# Patient Record
Sex: Female | Born: 1945 | Race: White | Hispanic: No | Marital: Married | Smoking: Never smoker
Health system: Southern US, Community
[De-identification: ages and names within clinical notes are randomized; demographics above are authoritative.]

## PROBLEM LIST (undated history)

## (undated) DIAGNOSIS — M1990 Unspecified osteoarthritis, unspecified site: Secondary | ICD-10-CM

## (undated) DIAGNOSIS — I10 Essential (primary) hypertension: Secondary | ICD-10-CM

## (undated) DIAGNOSIS — Z974 Presence of external hearing-aid: Secondary | ICD-10-CM

## (undated) DIAGNOSIS — T7840XA Allergy, unspecified, initial encounter: Secondary | ICD-10-CM

## (undated) DIAGNOSIS — K219 Gastro-esophageal reflux disease without esophagitis: Secondary | ICD-10-CM

## (undated) HISTORY — PX: CARPAL TUNNEL RELEASE: SHX101

## (undated) HISTORY — DX: Allergy, unspecified, initial encounter: T78.40XA

---

## 1999-03-14 ENCOUNTER — Other Ambulatory Visit: Admission: RE | Admit: 1999-03-14 | Discharge: 1999-03-14 | Payer: Self-pay | Admitting: Obstetrics and Gynecology

## 1999-04-04 ENCOUNTER — Encounter (INDEPENDENT_AMBULATORY_CARE_PROVIDER_SITE_OTHER): Payer: Self-pay

## 1999-04-04 ENCOUNTER — Other Ambulatory Visit: Admission: RE | Admit: 1999-04-04 | Discharge: 1999-04-04 | Payer: Self-pay | Admitting: *Deleted

## 1999-09-16 ENCOUNTER — Other Ambulatory Visit: Admission: RE | Admit: 1999-09-16 | Discharge: 1999-09-16 | Payer: Self-pay | Admitting: Obstetrics and Gynecology

## 2000-03-23 ENCOUNTER — Encounter: Admission: RE | Admit: 2000-03-23 | Discharge: 2000-03-23 | Payer: Self-pay | Admitting: Family Medicine

## 2000-03-23 ENCOUNTER — Encounter: Payer: Self-pay | Admitting: Family Medicine

## 2000-04-02 ENCOUNTER — Encounter: Payer: Self-pay | Admitting: Family Medicine

## 2000-04-02 ENCOUNTER — Other Ambulatory Visit: Admission: RE | Admit: 2000-04-02 | Discharge: 2000-04-02 | Payer: Self-pay | Admitting: Obstetrics and Gynecology

## 2000-04-02 ENCOUNTER — Encounter: Admission: RE | Admit: 2000-04-02 | Discharge: 2000-04-02 | Payer: Self-pay | Admitting: Family Medicine

## 2000-10-30 ENCOUNTER — Other Ambulatory Visit: Admission: RE | Admit: 2000-10-30 | Discharge: 2000-10-30 | Payer: Self-pay | Admitting: Obstetrics and Gynecology

## 2002-06-15 ENCOUNTER — Other Ambulatory Visit: Admission: RE | Admit: 2002-06-15 | Discharge: 2002-06-15 | Payer: Self-pay | Admitting: Family Medicine

## 2003-02-28 ENCOUNTER — Encounter: Admission: RE | Admit: 2003-02-28 | Discharge: 2003-02-28 | Payer: Self-pay | Admitting: Family Medicine

## 2003-02-28 ENCOUNTER — Encounter: Payer: Self-pay | Admitting: Family Medicine

## 2003-07-06 ENCOUNTER — Other Ambulatory Visit: Admission: RE | Admit: 2003-07-06 | Discharge: 2003-07-06 | Payer: Self-pay | Admitting: Family Medicine

## 2004-07-19 ENCOUNTER — Other Ambulatory Visit: Admission: RE | Admit: 2004-07-19 | Discharge: 2004-07-19 | Payer: Self-pay | Admitting: Family Medicine

## 2004-09-20 ENCOUNTER — Encounter: Admission: RE | Admit: 2004-09-20 | Discharge: 2004-09-20 | Payer: Self-pay | Admitting: Family Medicine

## 2005-09-16 ENCOUNTER — Other Ambulatory Visit: Admission: RE | Admit: 2005-09-16 | Discharge: 2005-09-16 | Payer: Self-pay | Admitting: Family Medicine

## 2005-10-15 ENCOUNTER — Encounter: Admission: RE | Admit: 2005-10-15 | Discharge: 2005-10-15 | Payer: Self-pay | Admitting: Family Medicine

## 2006-09-16 ENCOUNTER — Other Ambulatory Visit: Admission: RE | Admit: 2006-09-16 | Discharge: 2006-09-16 | Payer: Self-pay | Admitting: Family Medicine

## 2006-10-27 ENCOUNTER — Encounter: Admission: RE | Admit: 2006-10-27 | Discharge: 2006-10-27 | Payer: Self-pay | Admitting: Family Medicine

## 2007-11-03 ENCOUNTER — Encounter: Admission: RE | Admit: 2007-11-03 | Discharge: 2007-11-03 | Payer: Self-pay | Admitting: Family Medicine

## 2007-11-11 ENCOUNTER — Encounter: Admission: RE | Admit: 2007-11-11 | Discharge: 2007-11-11 | Payer: Self-pay | Admitting: Family Medicine

## 2008-09-21 ENCOUNTER — Other Ambulatory Visit: Admission: RE | Admit: 2008-09-21 | Discharge: 2008-09-21 | Payer: Self-pay | Admitting: Family Medicine

## 2008-11-30 ENCOUNTER — Encounter: Admission: RE | Admit: 2008-11-30 | Discharge: 2008-11-30 | Payer: Self-pay | Admitting: Family Medicine

## 2010-07-09 ENCOUNTER — Encounter: Admission: RE | Admit: 2010-07-09 | Discharge: 2010-07-09 | Payer: Self-pay | Admitting: Family Medicine

## 2010-09-25 ENCOUNTER — Other Ambulatory Visit
Admission: RE | Admit: 2010-09-25 | Discharge: 2010-09-25 | Payer: Self-pay | Source: Home / Self Care | Admitting: Family Medicine

## 2010-10-13 HISTORY — PX: CATARACT EXTRACTION: SUR2

## 2011-06-05 ENCOUNTER — Ambulatory Visit: Payer: Self-pay | Admitting: Ophthalmology

## 2011-06-25 ENCOUNTER — Ambulatory Visit: Payer: Self-pay | Admitting: Ophthalmology

## 2011-08-26 ENCOUNTER — Other Ambulatory Visit: Payer: Self-pay | Admitting: Family Medicine

## 2011-08-26 DIAGNOSIS — Z1231 Encounter for screening mammogram for malignant neoplasm of breast: Secondary | ICD-10-CM

## 2011-09-19 ENCOUNTER — Ambulatory Visit
Admission: RE | Admit: 2011-09-19 | Discharge: 2011-09-19 | Disposition: A | Discharge status: Home / Self Care | Payer: 59 | Source: Ambulatory Visit | Attending: Family Medicine | Admitting: Family Medicine

## 2011-09-19 DIAGNOSIS — Z1231 Encounter for screening mammogram for malignant neoplasm of breast: Secondary | ICD-10-CM

## 2012-07-20 DIAGNOSIS — Z23 Encounter for immunization: Secondary | ICD-10-CM | POA: Diagnosis not present

## 2012-07-22 DIAGNOSIS — H43819 Vitreous degeneration, unspecified eye: Secondary | ICD-10-CM | POA: Diagnosis not present

## 2012-08-26 DIAGNOSIS — H43819 Vitreous degeneration, unspecified eye: Secondary | ICD-10-CM | POA: Diagnosis not present

## 2012-10-28 ENCOUNTER — Other Ambulatory Visit: Payer: Self-pay | Admitting: Family Medicine

## 2012-10-28 ENCOUNTER — Other Ambulatory Visit (HOSPITAL_COMMUNITY)
Admission: RE | Admit: 2012-10-28 | Discharge: 2012-10-28 | Disposition: A | Discharge status: Home / Self Care | Payer: Medicare Other | Source: Ambulatory Visit | Attending: Family Medicine | Admitting: Family Medicine

## 2012-10-28 DIAGNOSIS — M949 Disorder of cartilage, unspecified: Secondary | ICD-10-CM | POA: Diagnosis not present

## 2012-10-28 DIAGNOSIS — Z0000 Encounter for general adult medical examination without abnormal findings: Secondary | ICD-10-CM | POA: Diagnosis not present

## 2012-10-28 DIAGNOSIS — Z124 Encounter for screening for malignant neoplasm of cervix: Secondary | ICD-10-CM | POA: Diagnosis not present

## 2012-10-28 DIAGNOSIS — Z23 Encounter for immunization: Secondary | ICD-10-CM | POA: Diagnosis not present

## 2012-10-28 DIAGNOSIS — M899 Disorder of bone, unspecified: Secondary | ICD-10-CM | POA: Diagnosis not present

## 2012-10-28 DIAGNOSIS — Z1211 Encounter for screening for malignant neoplasm of colon: Secondary | ICD-10-CM | POA: Diagnosis not present

## 2012-10-28 DIAGNOSIS — I10 Essential (primary) hypertension: Secondary | ICD-10-CM | POA: Diagnosis not present

## 2012-10-28 DIAGNOSIS — K219 Gastro-esophageal reflux disease without esophagitis: Secondary | ICD-10-CM | POA: Diagnosis not present

## 2012-10-28 DIAGNOSIS — Z131 Encounter for screening for diabetes mellitus: Secondary | ICD-10-CM | POA: Diagnosis not present

## 2012-10-28 DIAGNOSIS — E7800 Pure hypercholesterolemia, unspecified: Secondary | ICD-10-CM | POA: Diagnosis not present

## 2012-11-12 ENCOUNTER — Other Ambulatory Visit: Payer: Self-pay | Admitting: Family Medicine

## 2012-11-12 DIAGNOSIS — Z1231 Encounter for screening mammogram for malignant neoplasm of breast: Secondary | ICD-10-CM

## 2012-11-15 ENCOUNTER — Ambulatory Visit: Payer: 59

## 2012-11-16 DIAGNOSIS — Z1211 Encounter for screening for malignant neoplasm of colon: Secondary | ICD-10-CM | POA: Diagnosis not present

## 2012-11-16 DIAGNOSIS — Z124 Encounter for screening for malignant neoplasm of cervix: Secondary | ICD-10-CM | POA: Diagnosis not present

## 2012-11-18 DIAGNOSIS — H40009 Preglaucoma, unspecified, unspecified eye: Secondary | ICD-10-CM | POA: Diagnosis not present

## 2012-11-26 ENCOUNTER — Ambulatory Visit: Payer: 59

## 2012-12-03 ENCOUNTER — Ambulatory Visit
Admission: RE | Admit: 2012-12-03 | Discharge: 2012-12-03 | Disposition: A | Discharge status: Home / Self Care | Payer: Medicare Other | Source: Ambulatory Visit | Attending: Family Medicine | Admitting: Family Medicine

## 2012-12-03 DIAGNOSIS — Z1231 Encounter for screening mammogram for malignant neoplasm of breast: Secondary | ICD-10-CM | POA: Diagnosis not present

## 2012-12-15 DIAGNOSIS — M949 Disorder of cartilage, unspecified: Secondary | ICD-10-CM | POA: Diagnosis not present

## 2012-12-15 DIAGNOSIS — M899 Disorder of bone, unspecified: Secondary | ICD-10-CM | POA: Diagnosis not present

## 2013-04-27 DIAGNOSIS — I10 Essential (primary) hypertension: Secondary | ICD-10-CM | POA: Diagnosis not present

## 2013-05-19 DIAGNOSIS — H40009 Preglaucoma, unspecified, unspecified eye: Secondary | ICD-10-CM | POA: Diagnosis not present

## 2013-07-13 DIAGNOSIS — Z23 Encounter for immunization: Secondary | ICD-10-CM | POA: Diagnosis not present

## 2013-11-03 DIAGNOSIS — M899 Disorder of bone, unspecified: Secondary | ICD-10-CM | POA: Diagnosis not present

## 2013-11-03 DIAGNOSIS — Z79899 Other long term (current) drug therapy: Secondary | ICD-10-CM | POA: Diagnosis not present

## 2013-11-03 DIAGNOSIS — E7800 Pure hypercholesterolemia, unspecified: Secondary | ICD-10-CM | POA: Diagnosis not present

## 2013-11-03 DIAGNOSIS — K219 Gastro-esophageal reflux disease without esophagitis: Secondary | ICD-10-CM | POA: Diagnosis not present

## 2013-11-03 DIAGNOSIS — Z1211 Encounter for screening for malignant neoplasm of colon: Secondary | ICD-10-CM | POA: Diagnosis not present

## 2013-11-03 DIAGNOSIS — I10 Essential (primary) hypertension: Secondary | ICD-10-CM | POA: Diagnosis not present

## 2013-11-03 DIAGNOSIS — M949 Disorder of cartilage, unspecified: Secondary | ICD-10-CM | POA: Diagnosis not present

## 2013-11-03 DIAGNOSIS — Z0000 Encounter for general adult medical examination without abnormal findings: Secondary | ICD-10-CM | POA: Diagnosis not present

## 2013-11-11 DIAGNOSIS — Z1211 Encounter for screening for malignant neoplasm of colon: Secondary | ICD-10-CM | POA: Diagnosis not present

## 2013-11-21 DIAGNOSIS — H40009 Preglaucoma, unspecified, unspecified eye: Secondary | ICD-10-CM | POA: Diagnosis not present

## 2013-11-22 DIAGNOSIS — R05 Cough: Secondary | ICD-10-CM | POA: Diagnosis not present

## 2013-11-22 DIAGNOSIS — J069 Acute upper respiratory infection, unspecified: Secondary | ICD-10-CM | POA: Diagnosis not present

## 2013-11-22 DIAGNOSIS — R059 Cough, unspecified: Secondary | ICD-10-CM | POA: Diagnosis not present

## 2013-12-05 DIAGNOSIS — J209 Acute bronchitis, unspecified: Secondary | ICD-10-CM | POA: Diagnosis not present

## 2013-12-19 DIAGNOSIS — J209 Acute bronchitis, unspecified: Secondary | ICD-10-CM | POA: Diagnosis not present

## 2014-01-18 ENCOUNTER — Other Ambulatory Visit: Payer: Self-pay

## 2014-01-18 DIAGNOSIS — Z1231 Encounter for screening mammogram for malignant neoplasm of breast: Secondary | ICD-10-CM

## 2014-01-19 ENCOUNTER — Ambulatory Visit
Admission: RE | Admit: 2014-01-19 | Discharge: 2014-01-19 | Disposition: A | Discharge status: Home / Self Care | Payer: PRIVATE HEALTH INSURANCE | Source: Ambulatory Visit

## 2014-01-19 DIAGNOSIS — Z1231 Encounter for screening mammogram for malignant neoplasm of breast: Secondary | ICD-10-CM

## 2014-03-04 DIAGNOSIS — M25569 Pain in unspecified knee: Secondary | ICD-10-CM | POA: Diagnosis not present

## 2014-03-17 DIAGNOSIS — M1710 Unilateral primary osteoarthritis, unspecified knee: Secondary | ICD-10-CM | POA: Diagnosis not present

## 2014-05-04 DIAGNOSIS — R42 Dizziness and giddiness: Secondary | ICD-10-CM | POA: Diagnosis not present

## 2014-05-04 DIAGNOSIS — K219 Gastro-esophageal reflux disease without esophagitis: Secondary | ICD-10-CM | POA: Diagnosis not present

## 2014-05-04 DIAGNOSIS — M159 Polyosteoarthritis, unspecified: Secondary | ICD-10-CM | POA: Diagnosis not present

## 2014-05-04 DIAGNOSIS — I10 Essential (primary) hypertension: Secondary | ICD-10-CM | POA: Diagnosis not present

## 2014-05-15 DIAGNOSIS — H40009 Preglaucoma, unspecified, unspecified eye: Secondary | ICD-10-CM | POA: Diagnosis not present

## 2014-06-05 DIAGNOSIS — E871 Hypo-osmolality and hyponatremia: Secondary | ICD-10-CM | POA: Diagnosis not present

## 2014-06-13 DIAGNOSIS — Z23 Encounter for immunization: Secondary | ICD-10-CM | POA: Diagnosis not present

## 2014-07-12 DIAGNOSIS — M1710 Unilateral primary osteoarthritis, unspecified knee: Secondary | ICD-10-CM | POA: Diagnosis not present

## 2014-09-20 DIAGNOSIS — B351 Tinea unguium: Secondary | ICD-10-CM | POA: Diagnosis not present

## 2014-09-20 DIAGNOSIS — M79671 Pain in right foot: Secondary | ICD-10-CM | POA: Diagnosis not present

## 2014-09-20 DIAGNOSIS — M2042 Other hammer toe(s) (acquired), left foot: Secondary | ICD-10-CM | POA: Diagnosis not present

## 2014-09-20 DIAGNOSIS — M79672 Pain in left foot: Secondary | ICD-10-CM | POA: Diagnosis not present

## 2014-10-27 DIAGNOSIS — H40001 Preglaucoma, unspecified, right eye: Secondary | ICD-10-CM | POA: Diagnosis not present

## 2014-11-01 DIAGNOSIS — B351 Tinea unguium: Secondary | ICD-10-CM | POA: Diagnosis not present

## 2014-11-06 DIAGNOSIS — E780 Pure hypercholesterolemia: Secondary | ICD-10-CM | POA: Diagnosis not present

## 2014-11-06 DIAGNOSIS — M179 Osteoarthritis of knee, unspecified: Secondary | ICD-10-CM | POA: Diagnosis not present

## 2014-11-06 DIAGNOSIS — I10 Essential (primary) hypertension: Secondary | ICD-10-CM | POA: Diagnosis not present

## 2014-11-06 DIAGNOSIS — Z23 Encounter for immunization: Secondary | ICD-10-CM | POA: Diagnosis not present

## 2014-11-06 DIAGNOSIS — K219 Gastro-esophageal reflux disease without esophagitis: Secondary | ICD-10-CM | POA: Diagnosis not present

## 2014-11-09 DIAGNOSIS — H40001 Preglaucoma, unspecified, right eye: Secondary | ICD-10-CM | POA: Diagnosis not present

## 2015-01-23 DIAGNOSIS — S60021A Contusion of right index finger without damage to nail, initial encounter: Secondary | ICD-10-CM | POA: Diagnosis not present

## 2015-01-26 DIAGNOSIS — M79644 Pain in right finger(s): Secondary | ICD-10-CM | POA: Diagnosis not present

## 2015-01-26 DIAGNOSIS — M7989 Other specified soft tissue disorders: Secondary | ICD-10-CM | POA: Diagnosis not present

## 2015-01-26 DIAGNOSIS — S6710XA Crushing injury of unspecified finger(s), initial encounter: Secondary | ICD-10-CM | POA: Diagnosis not present

## 2015-01-31 DIAGNOSIS — M2042 Other hammer toe(s) (acquired), left foot: Secondary | ICD-10-CM | POA: Diagnosis not present

## 2015-01-31 DIAGNOSIS — B351 Tinea unguium: Secondary | ICD-10-CM | POA: Diagnosis not present

## 2015-02-06 ENCOUNTER — Ambulatory Visit
Admit: 2015-02-06 | Disposition: A | Discharge status: Home / Self Care | Payer: Self-pay | Attending: Orthopedic Surgery | Admitting: Orthopedic Surgery

## 2015-02-06 DIAGNOSIS — M79644 Pain in right finger(s): Secondary | ICD-10-CM | POA: Diagnosis not present

## 2015-02-08 ENCOUNTER — Other Ambulatory Visit: Payer: Self-pay

## 2015-02-08 DIAGNOSIS — Z1231 Encounter for screening mammogram for malignant neoplasm of breast: Secondary | ICD-10-CM

## 2015-02-16 ENCOUNTER — Emergency Department
Admission: EM | Admit: 2015-02-16 | Discharge: 2015-02-17 | Disposition: A | Discharge status: Home / Self Care | Payer: Medicare Other | Attending: Emergency Medicine | Admitting: Emergency Medicine

## 2015-02-16 ENCOUNTER — Encounter: Payer: Self-pay | Admitting: Emergency Medicine

## 2015-02-16 DIAGNOSIS — L299 Pruritus, unspecified: Secondary | ICD-10-CM | POA: Diagnosis not present

## 2015-02-16 DIAGNOSIS — L259 Unspecified contact dermatitis, unspecified cause: Secondary | ICD-10-CM | POA: Diagnosis not present

## 2015-02-16 DIAGNOSIS — I159 Secondary hypertension, unspecified: Secondary | ICD-10-CM | POA: Diagnosis not present

## 2015-02-16 DIAGNOSIS — I10 Essential (primary) hypertension: Secondary | ICD-10-CM | POA: Diagnosis not present

## 2015-02-16 DIAGNOSIS — R21 Rash and other nonspecific skin eruption: Secondary | ICD-10-CM | POA: Diagnosis not present

## 2015-02-16 HISTORY — DX: Gastro-esophageal reflux disease without esophagitis: K21.9

## 2015-02-16 HISTORY — DX: Essential (primary) hypertension: I10

## 2015-02-16 MED ORDER — TRIAMCINOLONE ACETONIDE 0.025 % EX OINT: 1.0000 "application " | TOPICAL_OINTMENT | Freq: Two times a day (BID) | CUTANEOUS | Status: AC

## 2015-02-16 MED ORDER — DEXAMETHASONE SODIUM PHOSPHATE 10 MG/ML IJ SOLN
10.0000 mg | Freq: Once | INTRAMUSCULAR | Status: AC
  Administered 2015-02-17: 10 mg via INTRAMUSCULAR

## 2015-02-16 MED ORDER — DEXAMETHASONE SODIUM PHOSPHATE 10 MG/ML IJ SOLN: 10.0000 mg | Freq: Once | INTRAMUSCULAR | Status: DC

## 2015-02-16 MED ORDER — RANITIDINE HCL 150 MG PO TABS: 150.0000 mg | ORAL_TABLET | Freq: Two times a day (BID) | ORAL | Status: AC

## 2015-02-16 MED ORDER — DEXAMETHASONE SODIUM PHOSPHATE 10 MG/ML IJ SOLN
INTRAMUSCULAR | Status: AC
  Administered 2015-02-17: 10 mg via INTRAMUSCULAR
  Filled 2015-02-16: qty 1

## 2015-02-16 MED ORDER — LORATADINE 10 MG PO TABS: 10.0000 mg | ORAL_TABLET | Freq: Every day | ORAL | Status: AC

## 2015-02-16 MED ORDER — FAMOTIDINE 20 MG PO TABS
ORAL_TABLET | ORAL | Status: AC
  Administered 2015-02-16: 40 mg via ORAL
  Filled 2015-02-16: qty 2

## 2015-02-16 MED ORDER — FAMOTIDINE 20 MG PO TABS
40.0000 mg | ORAL_TABLET | Freq: Once | ORAL | Status: AC
  Administered 2015-02-16: 40 mg via ORAL

## 2015-02-16 NOTE — ED Notes (Addendum)
Pt ambulatory reports around 530pm she developed funny feeling to her face and swelling to her lips. Pt also reports a rash developed to her arms and legs. Pt reports she took 50 mg of benadryl at 630pm. No swelling of tongue or throat region. Pt is speaking in full and complete sentences with no difficulty at this time. Pt now stating she went to [facility] to be seen for this but because her blood pressure was 190/109 they did not want to give her prednisone.

## 2015-02-16 NOTE — ED Notes (Signed)
Pt states she took 50mg  Benadryl at approx. 6:30PM prior to arrival to ER.

## 2015-02-16 NOTE — ED Provider Notes (Signed)
[facility] Emergency Department Provider Note    ____________________________________________  Time seen: 2152  I have reviewed the triage vital signs and the nursing notes.   HISTORY  Chief Complaint Allergic Reaction   \    HPI Vanessa Conley is a 69 y.o. female who developed a rash today unsure etiology only on her right arm and right leg the exact onset constantly itching rates it about 5 out of 10 no other complaints at this time nothing making it better or worse no other associated signs or symptoms     Past Medical History  Diagnosis Date  . Hypertension   . GERD (gastroesophageal reflux disease)     There are no active problems to display for this patient.   Past Surgical History  Procedure Laterality Date  . Cataract extraction  2012  . Carpal tunnel release      Current Outpatient Rx  Name  Route  Sig  Dispense  Refill  . loratadine (CLARITIN) 10 MG tablet   Oral   Take 1 tablet (10 mg total) by mouth daily.   30 tablet   2   . ranitidine (ZANTAC) 150 MG tablet   Oral   Take 1 tablet (150 mg total) by mouth 2 (two) times daily.   30 tablet   0   . triamcinolone (KENALOG) 0.025 % ointment   Topical   Apply 1 application topically 2 (two) times daily.   30 g   0     Allergies Alphagan  History reviewed. No pertinent family history.  Social History History  Substance Use Topics  . Smoking status: Never Smoker   . Smokeless tobacco: Not on file  . Alcohol Use: No    Review of Systems  Constitutional: Negative for fever. Eyes: Negative for visual changes. ENT: Negative for sore throat. Cardiovascular: Negative for chest pain. Respiratory: Negative for shortness of breath. Gastrointestinal: Negative for abdominal pain, vomiting and diarrhea. Genitourinary: Negative for dysuria. Musculoskeletal: Negative for back pain. Skin: Negative for rash. Neurological: Negative for headaches, focal weakness or  numbness.   10-point ROS otherwise negative.  ____________________________________________   PHYSICAL EXAM:  VITAL SIGNS: ED Triage Vitals  Enc Vitals Group     BP 02/16/15 2035 185/85 mmHg     Pulse Rate 02/16/15 2035 64     Resp 02/16/15 2216 18     Temp 02/16/15 2035 97.6 F (36.4 C)     Temp Source 02/16/15 2035 Oral     SpO2 02/16/15 2216 97 %     Weight 02/16/15 2035 177 lb (80.287 kg)     Height 02/16/15 2035 5\' 7"  (1.702 m)     Head Cir --      Peak Flow --      Pain Score --      Pain Loc --      Pain Edu? --      Excl. in [place]? --      Constitutional: Alert and oriented. Well appearing and in no distress. Eyes: Conjunctivae are normal. PERRL. Normal extraocular movements. ENT   Head: Normocephalic and atraumatic.   Nose: No congestion/rhinnorhea.   Mouth/Throat: Mucous membranes are moist.   Neck: No stridor. Hematological/Lymphatic/Immunilogical: No cervical lymphadenopathy. Cardiovascular: Normal rate, regular rhythm. Normal and symmetric distal pulses are present in all extremities. No murmurs, rubs, or gallops. Respiratory: Normal respiratory effort without tachypnea nor retractions. Breath sounds are clear and equal bilaterally. No wheezes/rales/rhonchi. Musculoskeletal: Nontender with normal range of motion  in all extremities. No joint effusions.  No lower extremity tenderness nor edema. Neurologic:  Normal speech and language. No gross focal neurologic deficits are appreciated. Speech is normal. No gait instability. Skin:  She has multiple red areas hive-like on her right arm and right leg and she has been scratching his ears are erythematous but not warm do not appear to be infected Psychiatric: Mood and affect are normal. Speech and behavior are normal. Patient exhibits appropriate insight and judgment.  ____________________________________________       PROCEDURES  Procedure(s) performed: None  Critical Care performed:  No  ____________________________________________   INITIAL IMPRESSION / ASSESSMENT AND PLAN / ED COURSE  Pertinent labs & imaging results that were available during my care of the patient were reviewed by me and considered in my medical decision making (see chart for details).  Rash question of contact dermatitis etiology unclear patient was given Decadron and Zantac took Benadryl prior to arrival symptoms as improved blood pressures came down we'll discharge home continue over-the-counter Benadryl follow-up with her doctor 2-3 days if not improving return if any acute concerns or worsening symptoms  ____________________________________________   FINAL CLINICAL IMPRESSION(S) / ED DIAGNOSES  Final diagnoses:  Contact dermatitis  Itching with irritation  Secondary hypertension, unspecified    Jayvier Burgher Verdene Rio, PA-C 02/16/15 [place], MD 02/17/15 [phone]

## 2015-02-17 DIAGNOSIS — L259 Unspecified contact dermatitis, unspecified cause: Secondary | ICD-10-CM | POA: Diagnosis not present

## 2015-02-17 NOTE — ED Notes (Signed)
Patient with no complaints at this time. Respirations even and unlabored. Skin warm/dry. Discharge instructions reviewed with patient at this time. Patient given opportunity to voice concerns/ask questions. Patient discharged at this time and left Emergency Department with steady gait.   

## 2015-02-19 ENCOUNTER — Ambulatory Visit: Payer: Medicare Other | Attending: Orthopedic Surgery | Admitting: Occupational Therapy

## 2015-02-19 ENCOUNTER — Encounter: Payer: PRIVATE HEALTH INSURANCE | Admitting: Occupational Therapy

## 2015-02-19 ENCOUNTER — Encounter: Payer: Self-pay | Admitting: Occupational Therapy

## 2015-02-19 DIAGNOSIS — M25641 Stiffness of right hand, not elsewhere classified: Secondary | ICD-10-CM | POA: Diagnosis not present

## 2015-02-19 DIAGNOSIS — M79644 Pain in right finger(s): Secondary | ICD-10-CM | POA: Insufficient documentation

## 2015-02-19 DIAGNOSIS — R600 Localized edema: Secondary | ICD-10-CM

## 2015-02-19 DIAGNOSIS — R609 Edema, unspecified: Secondary | ICD-10-CM | POA: Diagnosis not present

## 2015-02-19 NOTE — Therapy (Signed)
[place] [facility] [address] [place], [address] Phone: [phone]   Fax:  [phone]  Occupational Therapy Evaluation  Patient Details  Name: Vanessa Conley MRN: [id number] Date of Birth: [birth year] Referring Provider:  Duanne Guess, PA-C  Encounter Date: 02/19/2015      OT End of Session - 02/19/15 1251    Visit Number 1   Number of Visits 12   Date for OT Re-Evaluation 04/09/15   Authorization Type Medicare/Medicare Part A & B   Authorization - Visit Number 1   Authorization - Number of Visits --  awaiting sigend cert for visits approved   OT Start Time 8889   OT Stop Time 1136   OT Time Calculation (min) 49 min   Activity Tolerance Patient tolerated treatment well   Behavior During Therapy [facility] for tasks assessed/performed      Past Medical History  Diagnosis Date  . Hypertension   . GERD (gastroesophageal reflux disease)   . Allergy     Past Surgical History  Procedure Laterality Date  . Cataract extraction  2012  . Carpal tunnel release      There were no vitals filed for this visit.  Visit Diagnosis:  Stiffness of finger joint of right hand - Plan: Ot plan of care cert/re-cert  Edema of hand - Plan: Ot plan of care cert/re-cert  Pain in finger of right hand - Plan: Ot plan of care cert/re-cert      Subjective Assessment - 02/19/15 1057    Subjective  About 2 and a half months ago, pt and her husband were hanging drywall overhead when a piece slipped and jammed her right dominant hand between drywall and wall affecting her R IF PIP joint. She has difficulty with AROM and pain, stiffness and edema. She saw Emmit Alexanders and was referred to out-pt hand therapy on 02/09/15   Patient Stated Goals Increased AROM, decreased pain and swelling right IF.   Currently in Pain? No/denies           [facility] OT Assessment - 02/19/15 0001    Assessment   Diagnosis Right IF PIP partial  tear of ulnar collateral ligament PIPJ   Onset Date --  about 2 1/2 months ago March 2016.   Precautions   Precautions None   Balance Screen   Has the patient fallen in the past 6 months No   Has the patient had a decrease in activity level because of a fear of falling?  No   Is the patient reluctant to leave their home because of a fear of falling?  No   Prior Function   Level of Independence Independent with basic ADLs  Pt reports old CTR "years ago" and radial nerve release.   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   ADL   ADL comments Pt is overall I w/ ADL's but has difficulty opening doors, picking items up, pressure on PIPJ volarlly.   Right hand   Written Expression   Dominant Hand Right   Activity Tolerance   Activity Tolerance Endurance does not limit participation in activity   Cognition   Overall Cognitive Status Within Functional Limits for tasks assessed   Observation/Other Assessments   Observations Edema noted Right IF PIPJ   Outcome Measures Patient Rated Wrist and Hand (PRWHE)  Pain= 15/50 Function= 10/100   Sensation   Light Touch Appears Intact  Coordination   Gross Motor Movements are Fluid and Coordinated No   Fine Motor Movements are Fluid and Coordinated No   Right Hand AROM   R Index  MCP 0-90 --  0-83   R Index PIP 0-100 --  -24-90   R Index DIP 0-70 --  0-50   Hand Function   Right Hand Grip (lbs) 33#  Right vs 30# left   Right Hand Lateral Pinch 12.5 lbs  12.5# right vs 10 # left   Right Hand 3 Point Pinch 12 lbs  12# right vs 18# left   Comment Pain with pressure to voalr surface of PIPJ                   OT Treatments/Exercises (OP) - 02/19/15 0001    Hand Exercises   Other Hand Exercises Pt was instructed in tendon gliding ex's, blocked flexion ex's R IF PIP and DIP and blocked extension R IF PIP and performed in clinic today after verbal ed and demonstration.  2-3x/day  x5-10 reps ea, hold x3-5 seconds each   Manual Therapy   Manual Therapy Edema management;Joint mobilization  R Index finger PIPJ x30min   Edema Management Issued digi sleeve for edema control (pt ed in use, care and precautions and verbalized understanding)   right IF      HEP and edema control.          OT Education - 02/19/15 1249    Education provided Yes   Education Details HEP and edema control   Person(s) Educated Patient   Methods Explanation;Demonstration;Handout;Verbal cues   Comprehension Verbalized understanding;Returned demonstration          OT Short Term Goals - 02/19/15 1258    OT SHORT TERM GOAL #1   Title I HEP for increased ROM and functional grip related to ADL's and selfcare   Time 2   Period Weeks   Status New   OT SHORT TERM GOAL #2   Title I  Edema control techniques and compression to assist with increased AROM and functional use right IF   Time 3   Period Weeks   Status New           OT Long Term Goals - 02/19/15 1300    OT LONG TERM GOAL #1   Title Pt will demonstrate increased grip and pinch strength  by 5% or greater to assist with increased functional use and ability to open doors and grasp objects   Time 8   Period Weeks   Status New   OT LONG TERM GOAL #2   Title Pt will demonstrate increased AROM R IF for composite fisting to within 1cm or less [facility]   Time 8   Period Weeks   Status New   OT LONG TERM GOAL #3   Title Pt will report pain with functional grip as <2/10 for opening doors, grasp and performing ADL's    Time 8   Period Weeks   Status New   OT LONG TERM GOAL #4   Title Pt will demonstrate independence with edema control techiques R IF to allow for functional grip WFL's R IF   Time 6   Period Weeks   Status New               Plan - 02/19/15 1253    Clinical Impression Statement Pt is a 69 y/o RHD female s/p PIPJ partial collateral ligament tear about 2 1/2 months ago whom presents with  decreased AROM,  stiffness, edema and pain with grip volar tight IF w/ grasping and picking up objects. She should benefit from out-pt hand therapy to address edema, decreased AROM, strength and decreased functional use.    Pt will benefit from skilled therapeutic intervention in order to improve on the following deficits (Retired) Decreased strength;Pain;Decreased activity tolerance;Increased edema;Impaired UE functional use;Decreased range of motion;Decreased coordination;Impaired perceived functional ability;Impaired flexibility   Rehab Potential Good   OT Frequency 2x / week   OT Duration 6 weeks   OT Treatment/Interventions Ultrasound;Self-care/ADL training;DME and/or AE instruction;Passive range of motion;Parrafin;Patient/family education;Fluidtherapy;Moist Heat;Therapeutic exercise;Manual Therapy;Therapeutic exercises;Therapeutic activities   Plan Review HEP and edema control techniques, assess edema/measurements R IF PIPJ and upgrade as appropriate.   Consulted and Agree with Plan of Care Patient          G-Codes - Mar 06, 2015 1307    Functional Assessment Tool Used Clinical judgement and AROM assessment   Functional Limitation Carrying, moving and handling objects   Carrying, Moving and Handling Objects Current Status [phone]) At least 40 percent but less than 60 percent impaired, limited or restricted   Carrying, Moving and Handling Objects Goal Status (W8889) At least 1 percent but less than 20 percent impaired, limited or restricted      Problem List There are no active problems to display for this patient.   Carlynn Herald, [place] Holian Beth Dixon, OTR/L 06-Mar-2015, 1:16 PM  [place] [facility] [address], [place], [address] Phone: [phone]   Fax:  [phone]

## 2015-02-19 NOTE — Patient Instructions (Signed)
HEP and edema control

## 2015-02-22 ENCOUNTER — Ambulatory Visit: Payer: Medicare Other | Admitting: Occupational Therapy

## 2015-02-22 ENCOUNTER — Encounter: Payer: Self-pay | Admitting: Occupational Therapy

## 2015-02-22 DIAGNOSIS — R600 Localized edema: Secondary | ICD-10-CM

## 2015-02-22 DIAGNOSIS — M79644 Pain in right finger(s): Secondary | ICD-10-CM

## 2015-02-22 DIAGNOSIS — M25641 Stiffness of right hand, not elsewhere classified: Secondary | ICD-10-CM

## 2015-02-22 DIAGNOSIS — R609 Edema, unspecified: Secondary | ICD-10-CM | POA: Diagnosis not present

## 2015-02-22 NOTE — Patient Instructions (Signed)
[facility] as previously instructed. Pt currently Mod I and demonstrates increased AROM after therapeutic ex and modalities, manual techniques today in clinic. Consider upgrading HEP to putty next 1-2 visits.  AROM assessment R IF PIP extension after exercises today = -5* (compared to -24* at initial visit).

## 2015-02-22 NOTE — Therapy (Signed)
[place] [facility] [address], [place], [address] Phone: [phone]   Fax:  [phone]  Occupational Therapy Treatment  Patient Details  Name: Vanessa Conley MRN: [id number] Date of Birth: 01-25-1946 Referring Provider:  Alroy Dust, L.Marlou Sa, MD  Encounter Date: 02/22/2015      OT End of Session - 02/22/15 1045    Visit Number 2   Number of Visits 12  2/10 G Do G code at visit 10   Date for OT Re-Evaluation 04/09/15   Authorization Type Medicare/Medicare Part A & B   Authorization - Visit Number --  2/10 G   OT Start Time 0959   OT Stop Time 1039   OT Time Calculation (min) 40 min   Activity Tolerance Patient tolerated treatment well;No increased pain   Behavior During Therapy [facility] for tasks assessed/performed      Past Medical History  Diagnosis Date  . Hypertension   . GERD (gastroesophageal reflux disease)   . Allergy     Past Surgical History  Procedure Laterality Date  . Cataract extraction  2012  . Carpal tunnel release      There were no vitals filed for this visit.  Visit Diagnosis:  Stiffness of finger joint of right hand  Edema of hand  Pain in finger of right hand      Subjective Assessment - 02/22/15 1004    Subjective  Pt denies pain in right IF PIP joint, she reports that she is not having any difficulty with her HEP. She reports increased AROM "Yesterday, but its stiff again this morning"   Patient Stated Goals Increased AROM, decreased pain and swelling right IF.   Currently in Pain? No/denies                      OT Treatments/Exercises (OP) - 02/22/15 0001    Hand Exercises   Other Hand Exercises Reviewed and performed tendon gliding ex's, blocked flexion ex's R IF PIP and DIP and blocked extension R IF PIP and performed in clinic today at Mod I   Ultrasound   Ultrasound Location Right IF volar PIP   Ultrasound Parameters 1.0 w/cm2 w/ 3.3 mHz x52min  For  edema management, increse AROM and circulation   Ultrasound Goals Edema   RUE Paraffin   Number Minutes Paraffin 10 Minutes   RUE Paraffin Location Hand   Type --  Pt's hand in fist during paraffin for increased AROM/composi   Manual Therapy   Manual Therapy Edema management;Joint mobilization   Edema Management Pt is Mod I digi sleeve for edema control (pt ed in use, care and precautions and verbalized understanding)    Joint Mobilization Vibration and retrograde massage/joint mobs to assist with increasing AROM, decreasing edema   R IF 10 min       Pt was instructed to [facility] as previously instructed. Pt currently Mod I and demonstrates increased AROM after therapeutic ex and modalities, manual techniques today in clinic. Consider upgrading HEP to putty next 1-2 visits.  AROM assessment R IF PIP extension after exercises today = -5* (compared to -24* at initial visit).          OT Education - 02/22/15 1045    Education provided Yes   Education Details Review Home program, edema management    Person(s) Educated Patient   Methods Demonstration;Explanation   Comprehension Verbalized understanding;Returned demonstration          OT  Short Term Goals - 02/19/15 1258    OT SHORT TERM GOAL #1   Title I HEP for increased ROM and functional grip related to ADL's and selfcare   Time 2   Period Weeks   Status New   OT SHORT TERM GOAL #2   Title I  Edema control techniques and compression to assist with increased AROM and functional use right IF   Time 3   Period Weeks   Status New           OT Long Term Goals - 02/19/15 1300    OT LONG TERM GOAL #1   Title Pt will demonstrate increased grip and pinch strength  by 5% or greater to assist with increased functional use and ability to open doors and grasp objects   Time 8   Period Weeks   Status New   OT LONG TERM GOAL #2   Title Pt will demonstrate increased AROM R IF for composite fisting to within 1cm or  less [facility]   Time 8   Period Weeks   Status New   OT LONG TERM GOAL #3   Title Pt will report pain with functional grip as <2/10 for opening doors, grasp and performing ADL's    Time 8   Period Weeks   Status New   OT LONG TERM GOAL #4   Title Pt will demonstrate independence with edema control techiques R IF to allow for functional grip WFL's R IF   Time 6   Period Weeks   Status New               Plan - 02/22/15 1047    Clinical Impression Statement Pt demonstrates increased  AROM R IF after performance of HEP, manual techniques and modalities today. She continues to report pain only when attempting to open certain doors or metal cabinet pulls. Pt demonstrates clear improvement in active PIP extension as well (-5* compared to -24* at initial assessment). Pt should benefit from cont hand therapy to address goals, related to edema control, AROM, manual therapy and pt education.   Pt will benefit from skilled therapeutic intervention in order to improve on the following deficits (Retired) Decreased strength;Pain;Decreased activity tolerance;Increased edema;Impaired UE functional use;Decreased range of motion;Decreased coordination;Impaired perceived functional ability;Impaired flexibility   Rehab Potential Good   OT Frequency 2x / week   OT Duration 6 weeks   OT Treatment/Interventions Ultrasound;Self-care/ADL training;DME and/or AE instruction;Passive range of motion;Parrafin;Patient/family education;Fluidtherapy;Moist Heat;Therapeutic exercise;Manual Therapy;Therapeutic exercises;Therapeutic activities   Plan Consider upgrading to putty ex's if pt continues to be pain free R IF PIP joint. Review and upgrade HEP as appropriate to assist in increasing AROM and functional use R IF.   Consulted and Agree with Plan of Care Patient        Problem List There are no active problems to display for this patient.   Carlynn Herald, Shila Kruczek Beth Dixon, OTR/L 02/22/2015, 10:54 AM  [place] [facility] [address], [place], [address] Phone: [phone]   Fax:  [phone]

## 2015-02-26 ENCOUNTER — Ambulatory Visit: Payer: Medicare Other | Admitting: Occupational Therapy

## 2015-02-26 ENCOUNTER — Encounter: Payer: Self-pay | Admitting: Occupational Therapy

## 2015-02-26 DIAGNOSIS — M25641 Stiffness of right hand, not elsewhere classified: Secondary | ICD-10-CM | POA: Diagnosis not present

## 2015-02-26 DIAGNOSIS — R600 Localized edema: Secondary | ICD-10-CM

## 2015-02-26 DIAGNOSIS — M79644 Pain in right finger(s): Secondary | ICD-10-CM | POA: Diagnosis not present

## 2015-02-26 DIAGNOSIS — R609 Edema, unspecified: Secondary | ICD-10-CM | POA: Diagnosis not present

## 2015-02-26 NOTE — Therapy (Signed)
[facility] [address], [place], [address] Phone: [phone]   Fax:  [phone]  Occupational Therapy Treatment  Patient Details  Name: Vanessa Conley MRN: [id number] Date of Birth: July 04, 1946 Referring Provider:  Duanne Guess, PA-C  Encounter Date: 02/26/2015      OT End of Session - 02/26/15 1142    Visit Number 3   Number of Visits 12  3/10 G (do G code at visit 10)   Date for OT Re-Evaluation 04/09/15   Authorization Type Medicare/Medicare Part A & B   Authorization - Visit Number --  3/10 G   OT Start Time 1025   OT Stop Time 1137   OT Time Calculation (min) 44 min   Activity Tolerance Patient tolerated treatment well;No increased pain   Behavior During Therapy [facility] for tasks assessed/performed      Past Medical History  Diagnosis Date  . Hypertension   . GERD (gastroesophageal reflux disease)   . Allergy     Past Surgical History  Procedure Laterality Date  . Cataract extraction  2012  . Carpal tunnel release      There were no vitals filed for this visit.  Visit Diagnosis:  Stiffness of finger joint of right hand  Pain in finger of right hand  Edema of hand      Subjective Assessment - 02/26/15 1104    Subjective  Pt denies pain and has increased PIP flexion after exercies but notices that it gets stiff between ex's.   Patient is accompained by: Family member   Patient Stated Goals Increased AROM, decreased pain and swelling right IF.   Currently in Pain? No/denies                      OT Treatments/Exercises (OP) - 02/26/15 0001    Exercises   Exercises Hand   Hand Exercises   Other Hand Exercises Reviewed and performed tendon gliding ex's, blocked flexion ex's R IF PIP and DIP and blocked extension R IF PIP and performed in clinic today at Mod I   Other Hand Exercises Putty ex's: Grip,lateral and 3-point pinch and rolling out into log for digital  finger extension 5-10 reps each  Right hand, handout issued and reviewed, performed in clinic   Fine Motor Coordination   Fine Motor Coordination Tendon glides  putty right hand   Ultrasound   Ultrasound Location R IF   Ultrasound Parameters 1.0 w/cm2 w/ 3 mHz x4 min  For increased AROM, decreased Edema   Ultrasound Goals Edema;Other (Comment)   RUE Paraffin   Number Minutes Paraffin 10 Minutes   RUE Paraffin Location Hand   Type --  Pts hand in composite fist x5 dips for increased AROM/flexib   Manual Therapy   Manual Therapy Edema management;Joint mobilization  R IF PIP area x8 min   Edema Management Pt is Mod I digi sleeve for edema control (pt ed in use, care and precautions and verbalized understanding)    Joint Mobilization Vibration and retrograde massage/joint mobs to assist with increasing AROM, decreasing edema    Soft tissue mobilization --  x 10 min total      Pt was instructed in putty ex's for grip, pinches and finger extension ex's right hand. Pt performed in clinic after instruction and demonstration today x10 reps ea (Lime green putty was used).           OT Education - 02/26/15  1142    Education provided Yes   Education Details Upgraded HEP to include putty (lime green)   Person(s) Educated Patient   Methods Explanation;Demonstration;Handout   Comprehension Verbalized understanding;Returned demonstration          OT Short Term Goals - 02/26/15 1148    OT SHORT TERM GOAL #1   Title I HEP for increased ROM and functional grip related to ADL's and selfcare   Time 2   Period Weeks   Status Achieved   OT SHORT TERM GOAL #2   Title I  Edema control techniques and compression to assist with increased AROM and functional use right IF   Time 3   Period Weeks   Status Achieved           OT Long Term Goals - 02/26/15 1149    OT LONG TERM GOAL #1   Title Pt will demonstrate increased grip and pinch strength  by 5% or greater to assist with  increased functional use and ability to open doors and grasp objects   Time 8   Period Weeks   Status On-going   OT LONG TERM GOAL #2   Title Pt will demonstrate increased AROM R IF for composite fisting to within 1cm or less [facility]   Time 8   Period Weeks   Status On-going   OT LONG TERM GOAL #3   Title Pt will report pain with functional grip as <2/10 for opening doors, grasp and performing ADL's    Time 8   Period Weeks   Status On-going   OT LONG TERM GOAL #4   Title Pt will demonstrate independence with edema control techiques R IF to allow for functional grip WFL's R IF   Time 6   Period Weeks   Status On-going               Plan - 02/26/15 1144    Clinical Impression Statement Pt demonstrates increased AROM R IF after treatment today. Expecially terminal flexion and digital extension. Pt continues to deny pain R IF. Theraputty for grip, pinches and digital extension should assist with increasing strength and AROM R IF for functional use.   Pt will benefit from skilled therapeutic intervention in order to improve on the following deficits (Retired) Decreased strength;Pain;Decreased activity tolerance;Increased edema;Impaired UE functional use;Decreased range of motion;Decreased coordination;Impaired perceived functional ability;Impaired flexibility   Rehab Potential Good   OT Frequency 2x / week   OT Duration 6 weeks   Plan Assess putty ex's and HEP and upgrade as appropriate. Consider decreasing visits to 1x/week if pt cont to be painfree and demonstrates Mod I HEP.   Consulted and Agree with Plan of Care Patient        Problem List There are no active problems to display for this patient.   Carlynn Herald, Yailin Biederman Beth Dixon, OTR/L 02/26/2015, 11:53 AM  [facility] [address] [place], [address] Phone: [phone]   Fax:  [phone]

## 2015-02-26 NOTE — Patient Instructions (Signed)
Pt was instructed in putty ex's for grip, pinches and finger extension ex's right hand. Pt performed in clinic after instruction and demonstration today x10 reps ea (Lime green putty was used).

## 2015-02-28 ENCOUNTER — Ambulatory Visit
Admission: RE | Admit: 2015-02-28 | Discharge: 2015-02-28 | Disposition: A | Discharge status: Home / Self Care | Payer: Medicare Other | Source: Ambulatory Visit

## 2015-02-28 ENCOUNTER — Encounter: Payer: PRIVATE HEALTH INSURANCE | Admitting: Occupational Therapy

## 2015-02-28 DIAGNOSIS — Z1231 Encounter for screening mammogram for malignant neoplasm of breast: Secondary | ICD-10-CM | POA: Diagnosis not present

## 2015-03-01 ENCOUNTER — Ambulatory Visit: Payer: Medicare Other | Admitting: Occupational Therapy

## 2015-03-01 DIAGNOSIS — R600 Localized edema: Secondary | ICD-10-CM

## 2015-03-01 DIAGNOSIS — M25641 Stiffness of right hand, not elsewhere classified: Secondary | ICD-10-CM

## 2015-03-01 DIAGNOSIS — M79644 Pain in right finger(s): Secondary | ICD-10-CM

## 2015-03-01 DIAGNOSIS — R609 Edema, unspecified: Secondary | ICD-10-CM | POA: Diagnosis not present

## 2015-03-01 NOTE — Patient Instructions (Signed)
TO cont with putty - increase to 2 x day - 10 reps and can increase later to 3 x if no increase pain   Silicon digi sleeve for compression - was increase by 0.5 cm compare to L at  2nd PIP  Putty - green - add pulling like taffy with 3 point and intrinsic fist

## 2015-03-01 NOTE — Therapy (Signed)
[facility] [address], [place], [address] Phone: [phone]   Fax:  [phone]  Occupational Therapy Treatment  Patient Details  Name: Vanessa Conley MRN: [id number] Date of Birth: [birth year] Referring Provider:  Duanne Guess, PA-C  Encounter Date: 03/01/2015      OT End of Session - 03/01/15 1208    Visit Number 4   Number of Visits 12   Date for OT Re-Evaluation 04/09/15   Authorization Type Medicare/Medicare Part A & B   OT Start Time 1045   OT Stop Time 9924   OT Time Calculation (min) 46 min   Activity Tolerance Patient tolerated treatment well;No increased pain   Behavior During Therapy [facility] for tasks assessed/performed      Past Medical History  Diagnosis Date  . Hypertension   . GERD (gastroesophageal reflux disease)   . Allergy     Past Surgical History  Procedure Laterality Date  . Cataract extraction  2012  . Carpal tunnel release      There were no vitals filed for this visit.  Visit Diagnosis:  Stiffness of finger joint of right hand  Pain in finger of right hand  Edema of hand      Subjective Assessment - 03/01/15 1056    Subjective  No pain - knot on the bottom of my joint is gone - just one left - but I lost the compression sleeve    Patient is accompained by: Family member   Patient Stated Goals Increase AROM and swelling    Currently in Pain? No/denies                      OT Treatments/Exercises (OP) - 03/01/15 0001    Exercises   Exercises Hand   Hand Exercises   Other Hand Exercises Blocked AROM of DIP and PIP , tendongliding AROM ,    Other Hand Exercises Green putty reviewed and needed min v/c and add pulling with 3 point grip and intrinsic fist     Ultrasound   Ultrasound Location R 2nd volar PIPI   Ultrasound Parameters CUS at 3.3MHZ at 0.8 intensity    Ultrasound Goals Edema   RUE Paraffin   Number Minutes Paraffin 10 Minutes   RUE Paraffin Location Hand   Type --  hand in fist with heatingpad to increase flexion    Manual Therapy   Manual Therapy Soft tissue mobilization;Edema management   Edema Management Fitted with Silicon digi sleeve to soften thick band of distal middle phalanges    Joint Mobilization Vibration and retrograde massage/joint mobs to assist with increasing AROM, decreasing edema                 OT Education - 03/01/15 1208    Education provided Yes   Education Details see pt instruction   Person(s) Educated Patient   Methods Explanation;Demonstration;Verbal cues   Comprehension Verbalized understanding;Returned demonstration;Verbal cues required          OT Short Term Goals - 02/26/15 1148    OT SHORT TERM GOAL #1   Title I HEP for increased ROM and functional grip related to ADL's and selfcare   Time 2   Period Weeks   Status Achieved   OT SHORT TERM GOAL #2   Title I  Edema control techniques and compression to assist with increased AROM and functional use right IF   Time 3   Period Weeks  Status Achieved           OT Long Term Goals - 02/26/15 1149    OT LONG TERM GOAL #1   Title Pt will demonstrate increased grip and pinch strength  by 5% or greater to assist with increased functional use and ability to open doors and grasp objects   Time 8   Period Weeks   Status On-going   OT LONG TERM GOAL #2   Title Pt will demonstrate increased AROM R IF for composite fisting to within 1cm or less [facility]   Time 8   Period Weeks   Status On-going   OT LONG TERM GOAL #3   Title Pt will report pain with functional grip as <2/10 for opening doors, grasp and performing ADL's    Time 8   Period Weeks   Status On-going   OT LONG TERM GOAL #4   Title Pt will demonstrate independence with edema control techiques R IF to allow for functional grip WFL's R IF   Time 6   Period Weeks   Status On-going               Plan - 03/01/15 1209    Clinical Impression  Statement Pt show no pain and increase AROM - tolerated putty add to HEP last time well - increased reps and add 2 new ones nad provided new compression sleeve for edema- was increase .5 cm compare to L and still some fibrotic tissue at middle phlanges but responded great to vibration  -    Pt will benefit from skilled therapeutic intervention in order to improve on the following deficits (Retired) Decreased strength;Pain;Decreased activity tolerance;Increased edema;Impaired UE functional use;Decreased range of motion;Decreased coordination;Impaired perceived functional ability;Impaired flexibility   Rehab Potential Good   OT Frequency 1x / week   OT Duration 4 weeks   OT Treatment/Interventions Ultrasound;Self-care/ADL training;DME and/or AE instruction;Passive range of motion;Parrafin;Patient/family education;Fluidtherapy;Moist Heat;Therapeutic exercise;Manual Therapy;Therapeutic exercises;Therapeutic activities   Plan assess grip and prehension strength - fibrotic tissue on middle phalanges edema at PIP compare to L    Consulted and Agree with Plan of Care Patient        Problem List There are no active problems to display for this patient.   Rosalyn Gess   OTR/L;CLT  03/01/2015, 12:15 PM  [facility] [address] [place], [address] Phone: [phone]   Fax:  [phone]

## 2015-03-05 ENCOUNTER — Encounter: Payer: PRIVATE HEALTH INSURANCE | Admitting: Occupational Therapy

## 2015-03-08 ENCOUNTER — Encounter: Payer: Self-pay | Admitting: Occupational Therapy

## 2015-03-08 ENCOUNTER — Ambulatory Visit: Payer: Medicare Other | Admitting: Occupational Therapy

## 2015-03-08 DIAGNOSIS — M79644 Pain in right finger(s): Secondary | ICD-10-CM | POA: Diagnosis not present

## 2015-03-08 DIAGNOSIS — R609 Edema, unspecified: Secondary | ICD-10-CM | POA: Diagnosis not present

## 2015-03-08 DIAGNOSIS — M25641 Stiffness of right hand, not elsewhere classified: Secondary | ICD-10-CM

## 2015-03-08 DIAGNOSIS — R600 Localized edema: Secondary | ICD-10-CM

## 2015-03-08 NOTE — Patient Instructions (Signed)
Pt was educated to cont with edema control, HEP, putty and massage for thick band along distal middle phalanges R IF. Pt demonstrates independence in HEP and home program and has made nice gains in AROM, strengthening. Pt has met goals and will schedule f/u w/ MD and call therapist w/ questions/concerns, she will be d/c to I HEP at this time. She verbalized agreement with this in the clinic today.

## 2015-03-08 NOTE — Therapy (Signed)
[facility] [address] [place], [address] Phone: [phone]   Fax:  [phone]  Occupational Therapy Treatment  Patient Details  Name: Vanessa Conley MRN: [id number] Date of Birth: 31-Jul-1946 Referring Provider:  Duanne Guess, PA-C  Encounter Date: 03/08/2015      OT End of Session - 03/08/15 1209    Visit Number 5   Number of Visits 12   Date for OT Re-Evaluation 04/09/15   Authorization Type Medicare/Medicare Part A & B   Authorization - Visit Number --  4/10 G   OT Start Time 1114   OT Stop Time 1201   OT Time Calculation (min) 47 min   Activity Tolerance Patient tolerated treatment well;No increased pain   Behavior During Therapy [facility] for tasks assessed/performed      Past Medical History  Diagnosis Date  . Hypertension   . GERD (gastroesophageal reflux disease)   . Allergy     Past Surgical History  Procedure Laterality Date  . Cataract extraction  2012  . Carpal tunnel release      There were no vitals filed for this visit.  Visit Diagnosis:  Stiffness of finger joint of right hand  Pain in finger of right hand  Edema of hand      Subjective Assessment - 03/08/15 1121    Subjective  Pt denies pain left IF and states that she ocassionally has difficulty opening cabinets w/ metal handles, but "It is not nerve pain, I've had nerve pain before and it's not that or sensitivity"   Patient is accompained by: Family member   Patient Stated Goals Increase AROM and swelling    Currently in Pain? No/denies            [facility] OT Assessment - 03/08/15 0001    ROM / Strength   AROM / PROM / Strength AROM;Strength   Strength   Right Hand Grip (lbs) 43   Right Hand Lateral Pinch 13 lbs   Right Hand 3 Point Pinch 13 lbs   Right Hand AROM   R Index  MCP 0-90 --  0-90*   R Index PIP 0-100 --  -10-105*   R Index DIP 0-70 --  0-76*                  OT  Treatments/Exercises (OP) - 03/08/15 0001    Exercises   Exercises Hand   Hand Exercises   Other Hand Exercises Blocked AROM of DIP and PIP , tendongliding AROM ,    Other Hand Exercises Green putty reviewed and needed min v/c and add pulling with 3 point grip and intrinsic fist     Fine Motor Coordination   Fine Motor Coordination Tendon glides   Ultrasound   Ultrasound Location R volar PIPJ   Ultrasound Parameters CUS @ 3.3 MHZ @ 0.8 intensity   Ultrasound Goals Edema   RUE Paraffin   Number Minutes Paraffin 10 Minutes   RUE Paraffin Location Hand   Type --  Hand in fist to increase composite flexion   Manual Therapy   Manual Therapy Soft tissue mobilization;Edema management   Edema Management Fitted with Silicon digi sleeve to soften thick band of distal middle phalanges    Joint Mobilization Vibration and retrograde massage/joint mobs to assist with increasing AROM, decreasing edema        Pt was educated to cont with edema control, HEP, putty and massage for thick band along  distal middle phalanges R IF. Pt demonstrates independence in HEP and home program and has made nice gains in AROM, strengthening. Pt has met goals and will schedule f/u w/ MD and call therapist w/ questions/concerns, she will be d/c to I HEP at this time. She verbalized agreement with this in the clinic today.         OT Education - March 15, 2015 November 26, 1206    Education provided Yes   Education Details See pt instruction   Person(s) Educated Patient   Methods Explanation;Demonstration   Comprehension Verbalized understanding;Returned demonstration          OT Short Term Goals - March 15, 2015 1149    OT SHORT TERM GOAL #1   Title I HEP for increased ROM and functional grip related to ADL's and selfcare   Time 2   Period Weeks   Status Achieved   OT SHORT TERM GOAL #2   Title I  Edema control techniques and compression to assist with increased AROM and functional use right IF   Time 3   Period Weeks    Status Achieved           OT Long Term Goals - March 15, 2015 1216    OT LONG TERM GOAL #1   Title Pt will demonstrate increased grip and pinch strength  by 5% or greater to assist with increased functional use and ability to open doors and grasp objects   Time 8   Period Weeks   Status Achieved   OT LONG TERM GOAL #2   Title Pt will demonstrate increased AROM R IF for composite fisting to within 1cm or less [facility]   Time 8   Period Weeks   Status Achieved   OT LONG TERM GOAL #3   Title Pt will report pain with functional grip as <2/10 for opening doors, grasp and performing ADL's    Time 8   Period Weeks   Status Achieved   OT LONG TERM GOAL #4   Title Pt will demonstrate independence with edema control techiques R IF to allow for functional grip WFL's R IF   Time 6   Period Weeks   Status Achieved               Plan - 03/15/2015 1210    Clinical Impression Statement Pt has demonstrated Independence with home program for AROM, strengthening and edema control. She has met her goals and plans to cont HEP and call therapist over next 1-2 weeks w/ questiions/concerns or if she notices any changes in her symptoms right IF. She verbalized understanding of this.   Pt will benefit from skilled therapeutic intervention in order to improve on the following deficits (Retired) Decreased strength;Pain;Decreased activity tolerance;Increased edema;Impaired UE functional use;Decreased range of motion;Decreased coordination;Impaired perceived functional ability;Impaired flexibility   Rehab Potential Good   OT Frequency 1x / week   OT Duration 4 weeks   OT Treatment/Interventions Ultrasound;Self-care/ADL training;DME and/or AE instruction;Passive range of motion;Parrafin;Patient/family education;Fluidtherapy;Moist Heat;Therapeutic exercise;Manual Therapy;Therapeutic exercises;Therapeutic activities   Plan Pt will cont HEP independently and call therapist with questions/concerns. She plans to follow  up with MD over next several weeks.   Consulted and Agree with Plan of Care Patient          G-Codes - 2015/03/15 1215-11-27    Functional Assessment Tool Used Clinical judgement and AROM assessment   Functional Limitation Carrying, moving and handling objects   Carrying, Moving and Handling Objects Current Status (Q0347) At least 40 percent but less than  60 percent impaired, limited or restricted   Carrying, Moving and Handling Objects Goal Status (E8307) At least 1 percent but less than 20 percent impaired, limited or restricted          Problem List There are no active problems to display for this patient.   Carlynn Herald, Miles Leyda Beth Dixon, OTR/L 03/08/2015, 12:22 PM  [facility] [address] [place], [address] Phone: [phone]   Fax:  [phone]

## 2015-04-26 DIAGNOSIS — D1801 Hemangioma of skin and subcutaneous tissue: Secondary | ICD-10-CM | POA: Diagnosis not present

## 2015-04-26 DIAGNOSIS — L821 Other seborrheic keratosis: Secondary | ICD-10-CM | POA: Diagnosis not present

## 2015-04-26 DIAGNOSIS — D485 Neoplasm of uncertain behavior of skin: Secondary | ICD-10-CM | POA: Diagnosis not present

## 2015-04-26 DIAGNOSIS — Z1283 Encounter for screening for malignant neoplasm of skin: Secondary | ICD-10-CM | POA: Diagnosis not present

## 2015-04-26 DIAGNOSIS — E782 Mixed hyperlipidemia: Secondary | ICD-10-CM | POA: Diagnosis not present

## 2015-05-08 DIAGNOSIS — K219 Gastro-esophageal reflux disease without esophagitis: Secondary | ICD-10-CM | POA: Diagnosis not present

## 2015-05-08 DIAGNOSIS — E780 Pure hypercholesterolemia: Secondary | ICD-10-CM | POA: Diagnosis not present

## 2015-05-08 DIAGNOSIS — M179 Osteoarthritis of knee, unspecified: Secondary | ICD-10-CM | POA: Diagnosis not present

## 2015-05-08 DIAGNOSIS — I10 Essential (primary) hypertension: Secondary | ICD-10-CM | POA: Diagnosis not present

## 2015-05-08 DIAGNOSIS — H40001 Preglaucoma, unspecified, right eye: Secondary | ICD-10-CM | POA: Diagnosis not present

## 2015-05-10 DIAGNOSIS — D225 Melanocytic nevi of trunk: Secondary | ICD-10-CM | POA: Diagnosis not present

## 2015-05-10 DIAGNOSIS — D485 Neoplasm of uncertain behavior of skin: Secondary | ICD-10-CM | POA: Diagnosis not present

## 2015-06-15 DIAGNOSIS — M79672 Pain in left foot: Secondary | ICD-10-CM | POA: Diagnosis not present

## 2015-06-15 DIAGNOSIS — L97521 Non-pressure chronic ulcer of other part of left foot limited to breakdown of skin: Secondary | ICD-10-CM | POA: Diagnosis not present

## 2015-06-15 DIAGNOSIS — M2042 Other hammer toe(s) (acquired), left foot: Secondary | ICD-10-CM | POA: Diagnosis not present

## 2015-06-15 DIAGNOSIS — M79671 Pain in right foot: Secondary | ICD-10-CM | POA: Diagnosis not present

## 2015-06-15 DIAGNOSIS — B351 Tinea unguium: Secondary | ICD-10-CM | POA: Diagnosis not present

## 2015-06-25 DIAGNOSIS — Z23 Encounter for immunization: Secondary | ICD-10-CM | POA: Diagnosis not present

## 2015-11-05 DIAGNOSIS — H40001 Preglaucoma, unspecified, right eye: Secondary | ICD-10-CM | POA: Diagnosis not present

## 2015-11-06 DIAGNOSIS — Z1283 Encounter for screening for malignant neoplasm of skin: Secondary | ICD-10-CM | POA: Diagnosis not present

## 2015-11-06 DIAGNOSIS — T81590A Other complications of foreign body accidentally left in body following surgical operation, initial encounter: Secondary | ICD-10-CM | POA: Diagnosis not present

## 2015-11-06 DIAGNOSIS — Z872 Personal history of diseases of the skin and subcutaneous tissue: Secondary | ICD-10-CM | POA: Diagnosis not present

## 2015-11-06 DIAGNOSIS — D229 Melanocytic nevi, unspecified: Secondary | ICD-10-CM | POA: Diagnosis not present

## 2015-11-12 DIAGNOSIS — H40001 Preglaucoma, unspecified, right eye: Secondary | ICD-10-CM | POA: Diagnosis not present

## 2015-11-16 DIAGNOSIS — E7800 Pure hypercholesterolemia, unspecified: Secondary | ICD-10-CM | POA: Diagnosis not present

## 2015-11-16 DIAGNOSIS — I10 Essential (primary) hypertension: Secondary | ICD-10-CM | POA: Diagnosis not present

## 2015-11-16 DIAGNOSIS — M179 Osteoarthritis of knee, unspecified: Secondary | ICD-10-CM | POA: Diagnosis not present

## 2015-11-16 DIAGNOSIS — K219 Gastro-esophageal reflux disease without esophagitis: Secondary | ICD-10-CM | POA: Diagnosis not present

## 2016-02-04 ENCOUNTER — Other Ambulatory Visit: Payer: Self-pay

## 2016-02-04 DIAGNOSIS — Z1231 Encounter for screening mammogram for malignant neoplasm of breast: Secondary | ICD-10-CM

## 2016-02-14 DIAGNOSIS — B351 Tinea unguium: Secondary | ICD-10-CM | POA: Diagnosis not present

## 2016-02-14 DIAGNOSIS — L97521 Non-pressure chronic ulcer of other part of left foot limited to breakdown of skin: Secondary | ICD-10-CM | POA: Diagnosis not present

## 2016-02-14 DIAGNOSIS — M2042 Other hammer toe(s) (acquired), left foot: Secondary | ICD-10-CM | POA: Diagnosis not present

## 2016-03-06 ENCOUNTER — Ambulatory Visit
Admission: RE | Admit: 2016-03-06 | Discharge: 2016-03-06 | Disposition: A | Discharge status: Home / Self Care | Payer: Medicare Other | Source: Ambulatory Visit

## 2016-03-06 DIAGNOSIS — Z1231 Encounter for screening mammogram for malignant neoplasm of breast: Secondary | ICD-10-CM

## 2016-03-11 ENCOUNTER — Other Ambulatory Visit: Payer: Self-pay | Admitting: Family Medicine

## 2016-03-11 DIAGNOSIS — R928 Other abnormal and inconclusive findings on diagnostic imaging of breast: Secondary | ICD-10-CM

## 2016-03-17 ENCOUNTER — Ambulatory Visit
Admission: RE | Admit: 2016-03-17 | Discharge: 2016-03-17 | Disposition: A | Discharge status: Home / Self Care | Payer: Medicare Other | Source: Ambulatory Visit | Attending: Family Medicine | Admitting: Family Medicine

## 2016-03-17 DIAGNOSIS — R922 Inconclusive mammogram: Secondary | ICD-10-CM | POA: Diagnosis not present

## 2016-03-17 DIAGNOSIS — N6489 Other specified disorders of breast: Secondary | ICD-10-CM | POA: Diagnosis not present

## 2016-03-17 DIAGNOSIS — R928 Other abnormal and inconclusive findings on diagnostic imaging of breast: Secondary | ICD-10-CM

## 2016-05-15 DIAGNOSIS — M179 Osteoarthritis of knee, unspecified: Secondary | ICD-10-CM | POA: Diagnosis not present

## 2016-05-15 DIAGNOSIS — E7800 Pure hypercholesterolemia, unspecified: Secondary | ICD-10-CM | POA: Diagnosis not present

## 2016-05-15 DIAGNOSIS — I10 Essential (primary) hypertension: Secondary | ICD-10-CM | POA: Diagnosis not present

## 2016-05-15 DIAGNOSIS — F4320 Adjustment disorder, unspecified: Secondary | ICD-10-CM | POA: Diagnosis not present

## 2016-05-15 DIAGNOSIS — K219 Gastro-esophageal reflux disease without esophagitis: Secondary | ICD-10-CM | POA: Diagnosis not present

## 2016-05-15 DIAGNOSIS — N3000 Acute cystitis without hematuria: Secondary | ICD-10-CM | POA: Diagnosis not present

## 2016-06-03 DIAGNOSIS — Z23 Encounter for immunization: Secondary | ICD-10-CM | POA: Diagnosis not present

## 2016-06-09 DIAGNOSIS — H40001 Preglaucoma, unspecified, right eye: Secondary | ICD-10-CM | POA: Diagnosis not present

## 2016-06-20 DIAGNOSIS — T148 Other injury of unspecified body region: Secondary | ICD-10-CM | POA: Diagnosis not present

## 2016-10-25 DIAGNOSIS — J40 Bronchitis, not specified as acute or chronic: Secondary | ICD-10-CM | POA: Diagnosis not present

## 2016-11-04 DIAGNOSIS — Z86018 Personal history of other benign neoplasm: Secondary | ICD-10-CM | POA: Diagnosis not present

## 2016-11-04 DIAGNOSIS — D485 Neoplasm of uncertain behavior of skin: Secondary | ICD-10-CM | POA: Diagnosis not present

## 2016-11-17 DIAGNOSIS — I10 Essential (primary) hypertension: Secondary | ICD-10-CM | POA: Diagnosis not present

## 2016-11-17 DIAGNOSIS — R252 Cramp and spasm: Secondary | ICD-10-CM | POA: Diagnosis not present

## 2016-11-17 DIAGNOSIS — Z1159 Encounter for screening for other viral diseases: Secondary | ICD-10-CM | POA: Diagnosis not present

## 2016-11-17 DIAGNOSIS — M179 Osteoarthritis of knee, unspecified: Secondary | ICD-10-CM | POA: Diagnosis not present

## 2016-11-17 DIAGNOSIS — D225 Melanocytic nevi of trunk: Secondary | ICD-10-CM | POA: Diagnosis not present

## 2016-11-17 DIAGNOSIS — E7800 Pure hypercholesterolemia, unspecified: Secondary | ICD-10-CM | POA: Diagnosis not present

## 2016-11-17 DIAGNOSIS — D229 Melanocytic nevi, unspecified: Secondary | ICD-10-CM | POA: Diagnosis not present

## 2016-11-17 DIAGNOSIS — K219 Gastro-esophageal reflux disease without esophagitis: Secondary | ICD-10-CM | POA: Diagnosis not present

## 2016-12-08 DIAGNOSIS — H40001 Preglaucoma, unspecified, right eye: Secondary | ICD-10-CM | POA: Diagnosis not present

## 2016-12-08 DIAGNOSIS — I83893 Varicose veins of bilateral lower extremities with other complications: Secondary | ICD-10-CM | POA: Diagnosis not present

## 2016-12-15 DIAGNOSIS — H40001 Preglaucoma, unspecified, right eye: Secondary | ICD-10-CM | POA: Diagnosis not present

## 2017-01-08 DIAGNOSIS — R197 Diarrhea, unspecified: Secondary | ICD-10-CM | POA: Diagnosis not present

## 2017-01-16 DIAGNOSIS — H40001 Preglaucoma, unspecified, right eye: Secondary | ICD-10-CM | POA: Diagnosis not present

## 2017-02-20 ENCOUNTER — Other Ambulatory Visit: Payer: Self-pay | Admitting: Family Medicine

## 2017-02-20 DIAGNOSIS — H40001 Preglaucoma, unspecified, right eye: Secondary | ICD-10-CM | POA: Diagnosis not present

## 2017-02-20 DIAGNOSIS — Z1231 Encounter for screening mammogram for malignant neoplasm of breast: Secondary | ICD-10-CM

## 2017-03-24 ENCOUNTER — Encounter: Payer: Self-pay | Admitting: Radiology

## 2017-03-24 ENCOUNTER — Ambulatory Visit
Admission: RE | Admit: 2017-03-24 | Discharge: 2017-03-24 | Disposition: A | Discharge status: Home / Self Care | Payer: Medicare Other | Source: Ambulatory Visit | Attending: Family Medicine | Admitting: Family Medicine

## 2017-03-24 DIAGNOSIS — Z1231 Encounter for screening mammogram for malignant neoplasm of breast: Secondary | ICD-10-CM | POA: Diagnosis not present

## 2017-04-07 DIAGNOSIS — H40001 Preglaucoma, unspecified, right eye: Secondary | ICD-10-CM | POA: Diagnosis not present

## 2017-06-01 DIAGNOSIS — Z86018 Personal history of other benign neoplasm: Secondary | ICD-10-CM | POA: Diagnosis not present

## 2017-06-17 DIAGNOSIS — I83893 Varicose veins of bilateral lower extremities with other complications: Secondary | ICD-10-CM | POA: Diagnosis not present

## 2017-06-23 DIAGNOSIS — I83893 Varicose veins of bilateral lower extremities with other complications: Secondary | ICD-10-CM | POA: Diagnosis not present

## 2017-06-30 DIAGNOSIS — E7800 Pure hypercholesterolemia, unspecified: Secondary | ICD-10-CM | POA: Diagnosis not present

## 2017-06-30 DIAGNOSIS — K219 Gastro-esophageal reflux disease without esophagitis: Secondary | ICD-10-CM | POA: Diagnosis not present

## 2017-06-30 DIAGNOSIS — I10 Essential (primary) hypertension: Secondary | ICD-10-CM | POA: Diagnosis not present

## 2017-06-30 DIAGNOSIS — Z0000 Encounter for general adult medical examination without abnormal findings: Secondary | ICD-10-CM | POA: Diagnosis not present

## 2017-06-30 DIAGNOSIS — Z23 Encounter for immunization: Secondary | ICD-10-CM | POA: Diagnosis not present

## 2017-06-30 DIAGNOSIS — H9190 Unspecified hearing loss, unspecified ear: Secondary | ICD-10-CM | POA: Diagnosis not present

## 2017-06-30 DIAGNOSIS — M85859 Other specified disorders of bone density and structure, unspecified thigh: Secondary | ICD-10-CM | POA: Diagnosis not present

## 2017-07-01 DIAGNOSIS — I83893 Varicose veins of bilateral lower extremities with other complications: Secondary | ICD-10-CM | POA: Diagnosis not present

## 2017-07-07 DIAGNOSIS — H903 Sensorineural hearing loss, bilateral: Secondary | ICD-10-CM | POA: Diagnosis not present

## 2017-07-07 DIAGNOSIS — H9313 Tinnitus, bilateral: Secondary | ICD-10-CM | POA: Diagnosis not present

## 2017-07-07 DIAGNOSIS — H9113 Presbycusis, bilateral: Secondary | ICD-10-CM | POA: Diagnosis not present

## 2017-07-08 DIAGNOSIS — I83893 Varicose veins of bilateral lower extremities with other complications: Secondary | ICD-10-CM | POA: Diagnosis not present

## 2017-08-17 DIAGNOSIS — I83893 Varicose veins of bilateral lower extremities with other complications: Secondary | ICD-10-CM | POA: Diagnosis not present

## 2017-09-15 DIAGNOSIS — M8588 Other specified disorders of bone density and structure, other site: Secondary | ICD-10-CM | POA: Diagnosis not present

## 2017-09-16 DIAGNOSIS — S60512A Abrasion of left hand, initial encounter: Secondary | ICD-10-CM | POA: Diagnosis not present

## 2017-09-16 DIAGNOSIS — S61211A Laceration without foreign body of left index finger without damage to nail, initial encounter: Secondary | ICD-10-CM | POA: Diagnosis not present

## 2017-09-18 DIAGNOSIS — S61211D Laceration without foreign body of left index finger without damage to nail, subsequent encounter: Secondary | ICD-10-CM | POA: Diagnosis not present

## 2017-10-08 DIAGNOSIS — H40001 Preglaucoma, unspecified, right eye: Secondary | ICD-10-CM | POA: Diagnosis not present

## 2017-11-25 DIAGNOSIS — B351 Tinea unguium: Secondary | ICD-10-CM | POA: Diagnosis not present

## 2017-11-25 DIAGNOSIS — M79674 Pain in right toe(s): Secondary | ICD-10-CM | POA: Diagnosis not present

## 2017-11-25 DIAGNOSIS — M7752 Other enthesopathy of left foot: Secondary | ICD-10-CM | POA: Diagnosis not present

## 2017-11-25 DIAGNOSIS — M7672 Peroneal tendinitis, left leg: Secondary | ICD-10-CM | POA: Diagnosis not present

## 2017-11-25 DIAGNOSIS — Q6689 Other  specified congenital deformities of feet: Secondary | ICD-10-CM | POA: Diagnosis not present

## 2017-11-25 DIAGNOSIS — M79675 Pain in left toe(s): Secondary | ICD-10-CM | POA: Diagnosis not present

## 2017-12-28 ENCOUNTER — Ambulatory Visit
Admission: RE | Admit: 2017-12-28 | Discharge: 2017-12-28 | Disposition: A | Discharge status: Home / Self Care | Payer: Medicare Other | Source: Ambulatory Visit | Attending: Family Medicine | Admitting: Family Medicine

## 2017-12-28 ENCOUNTER — Other Ambulatory Visit: Payer: Self-pay | Admitting: Family Medicine

## 2017-12-28 DIAGNOSIS — I10 Essential (primary) hypertension: Secondary | ICD-10-CM | POA: Diagnosis not present

## 2017-12-28 DIAGNOSIS — M79642 Pain in left hand: Secondary | ICD-10-CM

## 2017-12-28 DIAGNOSIS — M19042 Primary osteoarthritis, left hand: Secondary | ICD-10-CM | POA: Diagnosis not present

## 2017-12-28 DIAGNOSIS — K219 Gastro-esophageal reflux disease without esophagitis: Secondary | ICD-10-CM | POA: Diagnosis not present

## 2017-12-28 DIAGNOSIS — E7800 Pure hypercholesterolemia, unspecified: Secondary | ICD-10-CM | POA: Diagnosis not present

## 2018-02-08 DIAGNOSIS — H40001 Preglaucoma, unspecified, right eye: Secondary | ICD-10-CM | POA: Diagnosis not present

## 2018-02-18 DIAGNOSIS — H40003 Preglaucoma, unspecified, bilateral: Secondary | ICD-10-CM | POA: Diagnosis not present

## 2018-03-15 ENCOUNTER — Other Ambulatory Visit: Payer: Self-pay | Admitting: Family Medicine

## 2018-03-15 DIAGNOSIS — Z1231 Encounter for screening mammogram for malignant neoplasm of breast: Secondary | ICD-10-CM

## 2018-04-08 ENCOUNTER — Ambulatory Visit
Admission: RE | Admit: 2018-04-08 | Discharge: 2018-04-08 | Disposition: A | Discharge status: Home / Self Care | Payer: Medicare Other | Source: Ambulatory Visit | Attending: Family Medicine | Admitting: Family Medicine

## 2018-04-08 DIAGNOSIS — Z1231 Encounter for screening mammogram for malignant neoplasm of breast: Secondary | ICD-10-CM

## 2018-06-03 DIAGNOSIS — Z1283 Encounter for screening for malignant neoplasm of skin: Secondary | ICD-10-CM | POA: Diagnosis not present

## 2018-06-03 DIAGNOSIS — L578 Other skin changes due to chronic exposure to nonionizing radiation: Secondary | ICD-10-CM | POA: Diagnosis not present

## 2018-06-03 DIAGNOSIS — Z86018 Personal history of other benign neoplasm: Secondary | ICD-10-CM | POA: Diagnosis not present

## 2018-07-01 DIAGNOSIS — M85859 Other specified disorders of bone density and structure, unspecified thigh: Secondary | ICD-10-CM | POA: Diagnosis not present

## 2018-07-01 DIAGNOSIS — K219 Gastro-esophageal reflux disease without esophagitis: Secondary | ICD-10-CM | POA: Diagnosis not present

## 2018-07-01 DIAGNOSIS — E7800 Pure hypercholesterolemia, unspecified: Secondary | ICD-10-CM | POA: Diagnosis not present

## 2018-07-01 DIAGNOSIS — I10 Essential (primary) hypertension: Secondary | ICD-10-CM | POA: Diagnosis not present

## 2018-07-01 DIAGNOSIS — Z0000 Encounter for general adult medical examination without abnormal findings: Secondary | ICD-10-CM | POA: Diagnosis not present

## 2018-07-01 DIAGNOSIS — Z23 Encounter for immunization: Secondary | ICD-10-CM | POA: Diagnosis not present

## 2018-07-01 DIAGNOSIS — Z1211 Encounter for screening for malignant neoplasm of colon: Secondary | ICD-10-CM | POA: Diagnosis not present

## 2018-07-13 DIAGNOSIS — Q6689 Other  specified congenital deformities of feet: Secondary | ICD-10-CM | POA: Diagnosis not present

## 2018-07-13 DIAGNOSIS — B351 Tinea unguium: Secondary | ICD-10-CM | POA: Diagnosis not present

## 2018-07-13 DIAGNOSIS — M79674 Pain in right toe(s): Secondary | ICD-10-CM | POA: Diagnosis not present

## 2018-07-13 DIAGNOSIS — M779 Enthesopathy, unspecified: Secondary | ICD-10-CM | POA: Diagnosis not present

## 2018-07-13 DIAGNOSIS — M79675 Pain in left toe(s): Secondary | ICD-10-CM | POA: Diagnosis not present

## 2018-08-02 DIAGNOSIS — K219 Gastro-esophageal reflux disease without esophagitis: Secondary | ICD-10-CM | POA: Diagnosis not present

## 2018-08-02 DIAGNOSIS — Z1211 Encounter for screening for malignant neoplasm of colon: Secondary | ICD-10-CM | POA: Diagnosis not present

## 2018-08-05 DIAGNOSIS — Z1211 Encounter for screening for malignant neoplasm of colon: Secondary | ICD-10-CM | POA: Diagnosis not present

## 2018-08-17 DIAGNOSIS — H40053 Ocular hypertension, bilateral: Secondary | ICD-10-CM | POA: Diagnosis not present

## 2018-09-22 DIAGNOSIS — J019 Acute sinusitis, unspecified: Secondary | ICD-10-CM | POA: Diagnosis not present

## 2018-12-16 DIAGNOSIS — M79675 Pain in left toe(s): Secondary | ICD-10-CM | POA: Diagnosis not present

## 2018-12-16 DIAGNOSIS — M79674 Pain in right toe(s): Secondary | ICD-10-CM | POA: Diagnosis not present

## 2018-12-16 DIAGNOSIS — B351 Tinea unguium: Secondary | ICD-10-CM | POA: Diagnosis not present

## 2018-12-28 DIAGNOSIS — H9113 Presbycusis, bilateral: Secondary | ICD-10-CM | POA: Diagnosis not present

## 2018-12-28 DIAGNOSIS — H903 Sensorineural hearing loss, bilateral: Secondary | ICD-10-CM | POA: Diagnosis not present

## 2019-01-04 DIAGNOSIS — R200 Anesthesia of skin: Secondary | ICD-10-CM | POA: Diagnosis not present

## 2019-01-04 DIAGNOSIS — M79642 Pain in left hand: Secondary | ICD-10-CM | POA: Diagnosis not present

## 2019-01-04 DIAGNOSIS — E7800 Pure hypercholesterolemia, unspecified: Secondary | ICD-10-CM | POA: Diagnosis not present

## 2019-01-04 DIAGNOSIS — I10 Essential (primary) hypertension: Secondary | ICD-10-CM | POA: Diagnosis not present

## 2019-01-04 DIAGNOSIS — K219 Gastro-esophageal reflux disease without esophagitis: Secondary | ICD-10-CM | POA: Diagnosis not present

## 2019-02-18 DIAGNOSIS — H40001 Preglaucoma, unspecified, right eye: Secondary | ICD-10-CM | POA: Diagnosis not present

## 2019-03-21 DIAGNOSIS — H40001 Preglaucoma, unspecified, right eye: Secondary | ICD-10-CM | POA: Diagnosis not present

## 2019-04-26 ENCOUNTER — Other Ambulatory Visit: Payer: Self-pay | Admitting: Family Medicine

## 2019-04-26 DIAGNOSIS — Z1231 Encounter for screening mammogram for malignant neoplasm of breast: Secondary | ICD-10-CM

## 2019-06-07 DIAGNOSIS — L578 Other skin changes due to chronic exposure to nonionizing radiation: Secondary | ICD-10-CM | POA: Diagnosis not present

## 2019-06-07 DIAGNOSIS — Z86018 Personal history of other benign neoplasm: Secondary | ICD-10-CM | POA: Diagnosis not present

## 2019-06-13 ENCOUNTER — Other Ambulatory Visit: Payer: Self-pay

## 2019-06-13 ENCOUNTER — Ambulatory Visit
Admission: RE | Admit: 2019-06-13 | Discharge: 2019-06-13 | Disposition: A | Discharge status: Home / Self Care | Payer: Medicare Other | Source: Ambulatory Visit | Attending: Family Medicine | Admitting: Family Medicine

## 2019-06-13 DIAGNOSIS — Z1231 Encounter for screening mammogram for malignant neoplasm of breast: Secondary | ICD-10-CM | POA: Diagnosis not present

## 2019-06-15 DIAGNOSIS — B351 Tinea unguium: Secondary | ICD-10-CM | POA: Diagnosis not present

## 2019-06-15 DIAGNOSIS — M79674 Pain in right toe(s): Secondary | ICD-10-CM | POA: Diagnosis not present

## 2019-06-15 DIAGNOSIS — M79675 Pain in left toe(s): Secondary | ICD-10-CM | POA: Diagnosis not present

## 2019-07-06 DIAGNOSIS — M85859 Other specified disorders of bone density and structure, unspecified thigh: Secondary | ICD-10-CM | POA: Diagnosis not present

## 2019-07-06 DIAGNOSIS — I10 Essential (primary) hypertension: Secondary | ICD-10-CM | POA: Diagnosis not present

## 2019-07-06 DIAGNOSIS — M179 Osteoarthritis of knee, unspecified: Secondary | ICD-10-CM | POA: Diagnosis not present

## 2019-07-06 DIAGNOSIS — K219 Gastro-esophageal reflux disease without esophagitis: Secondary | ICD-10-CM | POA: Diagnosis not present

## 2019-07-06 DIAGNOSIS — Z0000 Encounter for general adult medical examination without abnormal findings: Secondary | ICD-10-CM | POA: Diagnosis not present

## 2019-07-06 DIAGNOSIS — E7800 Pure hypercholesterolemia, unspecified: Secondary | ICD-10-CM | POA: Diagnosis not present

## 2019-07-22 DIAGNOSIS — I10 Essential (primary) hypertension: Secondary | ICD-10-CM | POA: Diagnosis not present

## 2019-07-22 DIAGNOSIS — Z23 Encounter for immunization: Secondary | ICD-10-CM | POA: Diagnosis not present

## 2019-09-11 DIAGNOSIS — Z20828 Contact with and (suspected) exposure to other viral communicable diseases: Secondary | ICD-10-CM | POA: Diagnosis not present

## 2019-09-22 DIAGNOSIS — H40001 Preglaucoma, unspecified, right eye: Secondary | ICD-10-CM | POA: Diagnosis not present

## 2020-01-04 DIAGNOSIS — K219 Gastro-esophageal reflux disease without esophagitis: Secondary | ICD-10-CM | POA: Diagnosis not present

## 2020-01-04 DIAGNOSIS — E7800 Pure hypercholesterolemia, unspecified: Secondary | ICD-10-CM | POA: Diagnosis not present

## 2020-01-04 DIAGNOSIS — R200 Anesthesia of skin: Secondary | ICD-10-CM | POA: Diagnosis not present

## 2020-01-04 DIAGNOSIS — I10 Essential (primary) hypertension: Secondary | ICD-10-CM | POA: Diagnosis not present

## 2020-01-04 DIAGNOSIS — M79642 Pain in left hand: Secondary | ICD-10-CM | POA: Diagnosis not present

## 2020-01-18 DIAGNOSIS — M79674 Pain in right toe(s): Secondary | ICD-10-CM | POA: Diagnosis not present

## 2020-01-18 DIAGNOSIS — M778 Other enthesopathies, not elsewhere classified: Secondary | ICD-10-CM | POA: Diagnosis not present

## 2020-01-18 DIAGNOSIS — M2042 Other hammer toe(s) (acquired), left foot: Secondary | ICD-10-CM | POA: Diagnosis not present

## 2020-01-18 DIAGNOSIS — M79675 Pain in left toe(s): Secondary | ICD-10-CM | POA: Diagnosis not present

## 2020-01-18 DIAGNOSIS — Q6689 Other  specified congenital deformities of feet: Secondary | ICD-10-CM | POA: Diagnosis not present

## 2020-01-18 DIAGNOSIS — B351 Tinea unguium: Secondary | ICD-10-CM | POA: Diagnosis not present

## 2020-01-18 DIAGNOSIS — M2041 Other hammer toe(s) (acquired), right foot: Secondary | ICD-10-CM | POA: Diagnosis not present

## 2020-02-07 DIAGNOSIS — L820 Inflamed seborrheic keratosis: Secondary | ICD-10-CM | POA: Diagnosis not present

## 2020-03-29 DIAGNOSIS — H353111 Nonexudative age-related macular degeneration, right eye, early dry stage: Secondary | ICD-10-CM | POA: Diagnosis not present

## 2020-03-29 DIAGNOSIS — H40001 Preglaucoma, unspecified, right eye: Secondary | ICD-10-CM | POA: Diagnosis not present

## 2020-06-06 DIAGNOSIS — L578 Other skin changes due to chronic exposure to nonionizing radiation: Secondary | ICD-10-CM | POA: Diagnosis not present

## 2020-06-06 DIAGNOSIS — Z86018 Personal history of other benign neoplasm: Secondary | ICD-10-CM | POA: Diagnosis not present

## 2020-06-19 DIAGNOSIS — R21 Rash and other nonspecific skin eruption: Secondary | ICD-10-CM | POA: Diagnosis not present

## 2020-06-23 DIAGNOSIS — Z20822 Contact with and (suspected) exposure to covid-19: Secondary | ICD-10-CM | POA: Diagnosis not present

## 2020-07-14 DIAGNOSIS — Z20822 Contact with and (suspected) exposure to covid-19: Secondary | ICD-10-CM | POA: Diagnosis not present

## 2020-07-21 DIAGNOSIS — Z23 Encounter for immunization: Secondary | ICD-10-CM | POA: Diagnosis not present

## 2020-07-31 DIAGNOSIS — R1033 Periumbilical pain: Secondary | ICD-10-CM | POA: Diagnosis not present

## 2020-07-31 DIAGNOSIS — M545 Low back pain, unspecified: Secondary | ICD-10-CM | POA: Diagnosis not present

## 2020-07-31 DIAGNOSIS — K219 Gastro-esophageal reflux disease without esophagitis: Secondary | ICD-10-CM | POA: Diagnosis not present

## 2020-07-31 DIAGNOSIS — M79642 Pain in left hand: Secondary | ICD-10-CM | POA: Diagnosis not present

## 2020-07-31 DIAGNOSIS — Z0000 Encounter for general adult medical examination without abnormal findings: Secondary | ICD-10-CM | POA: Diagnosis not present

## 2020-07-31 DIAGNOSIS — M85859 Other specified disorders of bone density and structure, unspecified thigh: Secondary | ICD-10-CM | POA: Diagnosis not present

## 2020-07-31 DIAGNOSIS — I10 Essential (primary) hypertension: Secondary | ICD-10-CM | POA: Diagnosis not present

## 2020-07-31 DIAGNOSIS — E7800 Pure hypercholesterolemia, unspecified: Secondary | ICD-10-CM | POA: Diagnosis not present

## 2020-08-02 ENCOUNTER — Other Ambulatory Visit: Payer: Self-pay

## 2020-08-02 DIAGNOSIS — Z1231 Encounter for screening mammogram for malignant neoplasm of breast: Secondary | ICD-10-CM

## 2020-08-29 DIAGNOSIS — R1033 Periumbilical pain: Secondary | ICD-10-CM | POA: Diagnosis not present

## 2020-09-04 ENCOUNTER — Ambulatory Visit
Admission: RE | Admit: 2020-09-04 | Discharge: 2020-09-04 | Disposition: A | Discharge status: Home / Self Care | Payer: Medicare Other | Source: Ambulatory Visit | Attending: Family Medicine | Admitting: Family Medicine

## 2020-09-04 ENCOUNTER — Other Ambulatory Visit: Payer: Self-pay

## 2020-09-04 DIAGNOSIS — Z1231 Encounter for screening mammogram for malignant neoplasm of breast: Secondary | ICD-10-CM

## 2020-09-24 DIAGNOSIS — Z23 Encounter for immunization: Secondary | ICD-10-CM | POA: Diagnosis not present

## 2020-10-01 DIAGNOSIS — H40051 Ocular hypertension, right eye: Secondary | ICD-10-CM | POA: Diagnosis not present

## 2020-10-09 DIAGNOSIS — M2042 Other hammer toe(s) (acquired), left foot: Secondary | ICD-10-CM | POA: Diagnosis not present

## 2020-10-09 DIAGNOSIS — M778 Other enthesopathies, not elsewhere classified: Secondary | ICD-10-CM | POA: Diagnosis not present

## 2020-10-09 DIAGNOSIS — M79674 Pain in right toe(s): Secondary | ICD-10-CM | POA: Diagnosis not present

## 2020-10-09 DIAGNOSIS — Q6689 Other  specified congenital deformities of feet: Secondary | ICD-10-CM | POA: Diagnosis not present

## 2020-10-09 DIAGNOSIS — L97511 Non-pressure chronic ulcer of other part of right foot limited to breakdown of skin: Secondary | ICD-10-CM | POA: Diagnosis not present

## 2020-10-09 DIAGNOSIS — M79675 Pain in left toe(s): Secondary | ICD-10-CM | POA: Diagnosis not present

## 2020-10-09 DIAGNOSIS — B351 Tinea unguium: Secondary | ICD-10-CM | POA: Diagnosis not present

## 2020-10-09 DIAGNOSIS — M2041 Other hammer toe(s) (acquired), right foot: Secondary | ICD-10-CM | POA: Diagnosis not present

## 2021-01-29 DIAGNOSIS — K219 Gastro-esophageal reflux disease without esophagitis: Secondary | ICD-10-CM | POA: Diagnosis not present

## 2021-01-29 DIAGNOSIS — R200 Anesthesia of skin: Secondary | ICD-10-CM | POA: Diagnosis not present

## 2021-01-29 DIAGNOSIS — M79642 Pain in left hand: Secondary | ICD-10-CM | POA: Diagnosis not present

## 2021-01-29 DIAGNOSIS — E7800 Pure hypercholesterolemia, unspecified: Secondary | ICD-10-CM | POA: Diagnosis not present

## 2021-01-29 DIAGNOSIS — I10 Essential (primary) hypertension: Secondary | ICD-10-CM | POA: Diagnosis not present

## 2021-04-05 DIAGNOSIS — H2511 Age-related nuclear cataract, right eye: Secondary | ICD-10-CM | POA: Diagnosis not present

## 2021-04-05 DIAGNOSIS — H40053 Ocular hypertension, bilateral: Secondary | ICD-10-CM | POA: Diagnosis not present

## 2021-06-10 DIAGNOSIS — Z86018 Personal history of other benign neoplasm: Secondary | ICD-10-CM | POA: Diagnosis not present

## 2021-06-10 DIAGNOSIS — L578 Other skin changes due to chronic exposure to nonionizing radiation: Secondary | ICD-10-CM | POA: Diagnosis not present

## 2021-07-27 DIAGNOSIS — Z23 Encounter for immunization: Secondary | ICD-10-CM | POA: Diagnosis not present

## 2021-08-13 ENCOUNTER — Other Ambulatory Visit: Payer: Self-pay | Admitting: Family Medicine

## 2021-08-13 DIAGNOSIS — Z1231 Encounter for screening mammogram for malignant neoplasm of breast: Secondary | ICD-10-CM

## 2021-08-13 DIAGNOSIS — M85859 Other specified disorders of bone density and structure, unspecified thigh: Secondary | ICD-10-CM

## 2021-08-13 DIAGNOSIS — E2839 Other primary ovarian failure: Secondary | ICD-10-CM | POA: Diagnosis not present

## 2021-08-13 DIAGNOSIS — I10 Essential (primary) hypertension: Secondary | ICD-10-CM | POA: Diagnosis not present

## 2021-08-13 DIAGNOSIS — M79642 Pain in left hand: Secondary | ICD-10-CM | POA: Diagnosis not present

## 2021-08-13 DIAGNOSIS — M545 Low back pain, unspecified: Secondary | ICD-10-CM | POA: Diagnosis not present

## 2021-08-13 DIAGNOSIS — K219 Gastro-esophageal reflux disease without esophagitis: Secondary | ICD-10-CM | POA: Diagnosis not present

## 2021-08-13 DIAGNOSIS — E7800 Pure hypercholesterolemia, unspecified: Secondary | ICD-10-CM | POA: Diagnosis not present

## 2021-08-13 DIAGNOSIS — Z0000 Encounter for general adult medical examination without abnormal findings: Secondary | ICD-10-CM | POA: Diagnosis not present

## 2021-08-23 DIAGNOSIS — Z23 Encounter for immunization: Secondary | ICD-10-CM | POA: Diagnosis not present

## 2021-09-18 ENCOUNTER — Ambulatory Visit: Payer: Medicare Other

## 2021-09-19 ENCOUNTER — Ambulatory Visit: Payer: Medicare Other

## 2021-10-03 DIAGNOSIS — H40053 Ocular hypertension, bilateral: Secondary | ICD-10-CM | POA: Diagnosis not present

## 2021-10-17 ENCOUNTER — Ambulatory Visit
Admission: RE | Admit: 2021-10-17 | Discharge: 2021-10-17 | Disposition: A | Discharge status: Home / Self Care | Payer: Medicare Other | Source: Ambulatory Visit | Attending: Family Medicine | Admitting: Family Medicine

## 2021-10-17 ENCOUNTER — Other Ambulatory Visit: Payer: Self-pay

## 2021-10-17 DIAGNOSIS — Z1231 Encounter for screening mammogram for malignant neoplasm of breast: Secondary | ICD-10-CM

## 2021-11-13 DIAGNOSIS — T1511XA Foreign body in conjunctival sac, right eye, initial encounter: Secondary | ICD-10-CM | POA: Diagnosis not present

## 2022-01-10 ENCOUNTER — Ambulatory Visit
Admission: RE | Admit: 2022-01-10 | Discharge: 2022-01-10 | Disposition: A | Discharge status: Home / Self Care | Payer: Medicare Other | Source: Ambulatory Visit | Attending: Family Medicine | Admitting: Family Medicine

## 2022-01-10 DIAGNOSIS — M85859 Other specified disorders of bone density and structure, unspecified thigh: Secondary | ICD-10-CM

## 2022-01-10 DIAGNOSIS — Z780 Asymptomatic menopausal state: Secondary | ICD-10-CM | POA: Diagnosis not present

## 2022-01-10 DIAGNOSIS — E2839 Other primary ovarian failure: Secondary | ICD-10-CM

## 2022-01-10 DIAGNOSIS — M85851 Other specified disorders of bone density and structure, right thigh: Secondary | ICD-10-CM | POA: Diagnosis not present

## 2022-01-28 DIAGNOSIS — M542 Cervicalgia: Secondary | ICD-10-CM | POA: Diagnosis not present

## 2022-02-12 DIAGNOSIS — E7800 Pure hypercholesterolemia, unspecified: Secondary | ICD-10-CM | POA: Diagnosis not present

## 2022-02-12 DIAGNOSIS — Z23 Encounter for immunization: Secondary | ICD-10-CM | POA: Diagnosis not present

## 2022-02-12 DIAGNOSIS — M542 Cervicalgia: Secondary | ICD-10-CM | POA: Diagnosis not present

## 2022-02-12 DIAGNOSIS — K219 Gastro-esophageal reflux disease without esophagitis: Secondary | ICD-10-CM | POA: Diagnosis not present

## 2022-02-12 DIAGNOSIS — M79642 Pain in left hand: Secondary | ICD-10-CM | POA: Diagnosis not present

## 2022-02-12 DIAGNOSIS — I10 Essential (primary) hypertension: Secondary | ICD-10-CM | POA: Diagnosis not present

## 2022-04-03 DIAGNOSIS — H903 Sensorineural hearing loss, bilateral: Secondary | ICD-10-CM | POA: Diagnosis not present

## 2022-04-03 DIAGNOSIS — H40053 Ocular hypertension, bilateral: Secondary | ICD-10-CM | POA: Diagnosis not present

## 2022-04-10 DIAGNOSIS — L600 Ingrowing nail: Secondary | ICD-10-CM | POA: Diagnosis not present

## 2022-04-10 DIAGNOSIS — M79675 Pain in left toe(s): Secondary | ICD-10-CM | POA: Diagnosis not present

## 2022-04-10 DIAGNOSIS — M778 Other enthesopathies, not elsewhere classified: Secondary | ICD-10-CM | POA: Diagnosis not present

## 2022-04-10 DIAGNOSIS — M2041 Other hammer toe(s) (acquired), right foot: Secondary | ICD-10-CM | POA: Diagnosis not present

## 2022-04-10 DIAGNOSIS — B351 Tinea unguium: Secondary | ICD-10-CM | POA: Diagnosis not present

## 2022-04-10 DIAGNOSIS — M79674 Pain in right toe(s): Secondary | ICD-10-CM | POA: Diagnosis not present

## 2022-04-10 DIAGNOSIS — M2042 Other hammer toe(s) (acquired), left foot: Secondary | ICD-10-CM | POA: Diagnosis not present

## 2022-04-11 DIAGNOSIS — H2511 Age-related nuclear cataract, right eye: Secondary | ICD-10-CM | POA: Diagnosis not present

## 2022-04-29 DIAGNOSIS — H2511 Age-related nuclear cataract, right eye: Secondary | ICD-10-CM | POA: Diagnosis not present

## 2022-05-15 ENCOUNTER — Encounter: Payer: Self-pay | Admitting: Ophthalmology

## 2022-05-21 NOTE — Anesthesia Preprocedure Evaluation (Addendum)
Anesthesia Evaluation  Patient identified by MRN, date of birth, ID band Patient awake    Reviewed: Allergy & Precautions, NPO status , Patient's Chart, lab work & pertinent test results, reviewed documented beta blocker date and time   History of Anesthesia Complications Negative for: history of anesthetic complications  Airway Mallampati: II  TM Distance: >3 FB Neck ROM: full    Dental  (+) Partial Upper   Pulmonary neg pulmonary ROS,    Pulmonary exam normal        Cardiovascular hypertension, Pt. on medications and Pt. on home beta blockers Normal cardiovascular exam     Neuro/Psych Anxiety negative neurological ROS  negative psych ROS   GI/Hepatic Neg liver ROS, hiatal hernia, GERD  ,  Endo/Other  negative endocrine ROS  Renal/GU      Musculoskeletal  (+) Arthritis ,   Abdominal Normal abdominal exam  (+)   Peds  Hematology negative hematology ROS (+)   Anesthesia Other Findings Past Medical History: No date: Allergy No date: Arthritis No date: GERD (gastroesophageal reflux disease) No date: Hypertension No date: Wears hearing aid  Past Surgical History: No date: CARPAL TUNNEL RELEASE 2012: CATARACT EXTRACTION  BMI    Body Mass Index: 25.96 kg/m      Reproductive/Obstetrics negative OB ROS                            Anesthesia Physical Anesthesia Plan  ASA: 2  Anesthesia Plan: MAC   Post-op Pain Management: Minimal or no pain anticipated   Induction: Intravenous  PONV Risk Score and Plan: 2 and Treatment may vary due to age or medical condition  Airway Management Planned: Natural Airway and Nasal Cannula  Additional Equipment:   Intra-op Plan:   Post-operative Plan:   Informed Consent: I have reviewed the patients History and Physical, chart, labs and discussed the procedure including the risks, benefits and alternatives for the proposed anesthesia with the  patient or authorized representative who has indicated his/her understanding and acceptance.       Plan Discussed with: Anesthesiologist, CRNA and Surgeon  Anesthesia Plan Comments:        Anesthesia Quick Evaluation

## 2022-05-21 NOTE — Discharge Instructions (Signed)

## 2022-05-26 ENCOUNTER — Encounter
Admission: RE | Disposition: A | Discharge status: Home / Self Care | Payer: Self-pay | Source: Home / Self Care | Attending: Ophthalmology

## 2022-05-26 ENCOUNTER — Other Ambulatory Visit: Payer: Self-pay

## 2022-05-26 ENCOUNTER — Ambulatory Visit: Payer: Medicare Other | Admitting: Anesthesiology

## 2022-05-26 ENCOUNTER — Encounter: Payer: Self-pay | Admitting: Ophthalmology

## 2022-05-26 ENCOUNTER — Ambulatory Visit (AMBULATORY_SURGERY_CENTER): Payer: Medicare Other | Admitting: Anesthesiology

## 2022-05-26 ENCOUNTER — Ambulatory Visit
Admission: RE | Admit: 2022-05-26 | Discharge: 2022-05-26 | Disposition: A | Discharge status: Home / Self Care | Payer: Medicare Other | Attending: Ophthalmology | Admitting: Ophthalmology

## 2022-05-26 DIAGNOSIS — Z79899 Other long term (current) drug therapy: Secondary | ICD-10-CM | POA: Insufficient documentation

## 2022-05-26 DIAGNOSIS — H2511 Age-related nuclear cataract, right eye: Secondary | ICD-10-CM | POA: Diagnosis not present

## 2022-05-26 DIAGNOSIS — I10 Essential (primary) hypertension: Secondary | ICD-10-CM | POA: Diagnosis not present

## 2022-05-26 DIAGNOSIS — F419 Anxiety disorder, unspecified: Secondary | ICD-10-CM | POA: Diagnosis not present

## 2022-05-26 DIAGNOSIS — M1990 Unspecified osteoarthritis, unspecified site: Secondary | ICD-10-CM

## 2022-05-26 DIAGNOSIS — K219 Gastro-esophageal reflux disease without esophagitis: Secondary | ICD-10-CM | POA: Diagnosis not present

## 2022-05-26 HISTORY — DX: Unspecified osteoarthritis, unspecified site: M19.90

## 2022-05-26 HISTORY — DX: Presence of external hearing-aid: Z97.4

## 2022-05-26 HISTORY — PX: CATARACT EXTRACTION W/PHACO: SHX586

## 2022-05-26 SURGERY — PHACOEMULSIFICATION, CATARACT, WITH IOL INSERTION
Anesthesia: Monitor Anesthesia Care | Site: Eye | Laterality: Right

## 2022-05-26 MED ORDER — MIDAZOLAM HCL 2 MG/2ML IJ SOLN
INTRAMUSCULAR | Status: DC | PRN
  Administered 2022-05-26: 2 mg via INTRAVENOUS

## 2022-05-26 MED ORDER — SIGHTPATH DOSE#1 BSS IO SOLN
INTRAOCULAR | Status: DC | PRN
  Administered 2022-05-26: 68 mL via OPHTHALMIC

## 2022-05-26 MED ORDER — SIGHTPATH DOSE#1 BSS IO SOLN
INTRAOCULAR | Status: DC | PRN
  Administered 2022-05-26: 1 mL via INTRAMUSCULAR

## 2022-05-26 MED ORDER — ARMC OPHTHALMIC DILATING DROPS
1.0000 | OPHTHALMIC | Status: DC | PRN
  Administered 2022-05-26 (×3): 1 via OPHTHALMIC

## 2022-05-26 MED ORDER — MOXIFLOXACIN HCL 0.5 % OP SOLN
OPHTHALMIC | Status: DC | PRN
  Administered 2022-05-26: 0.2 mL via OPHTHALMIC

## 2022-05-26 MED ORDER — FENTANYL CITRATE (PF) 100 MCG/2ML IJ SOLN
INTRAMUSCULAR | Status: DC | PRN
Start: 2022-05-26 — End: 2022-05-26
  Administered 2022-05-26 (×2): 50 ug via INTRAVENOUS

## 2022-05-26 MED ORDER — SIGHTPATH DOSE#1 BSS IO SOLN
INTRAOCULAR | Status: DC | PRN
  Administered 2022-05-26: 15 mL

## 2022-05-26 MED ORDER — SIGHTPATH DOSE#1 NA HYALUR & NA CHOND-NA HYALUR IO KIT
PACK | INTRAOCULAR | Status: DC | PRN
  Administered 2022-05-26: 1 via OPHTHALMIC

## 2022-05-26 MED ORDER — TETRACAINE HCL 0.5 % OP SOLN
1.0000 [drp] | OPHTHALMIC | Status: DC | PRN
  Administered 2022-05-26 (×3): 1 [drp] via OPHTHALMIC

## 2022-05-26 SURGICAL SUPPLY — 11 items
CATARACT SUITE SIGHTPATH (MISCELLANEOUS) ×2 IMPLANT
FEE CATARACT SUITE SIGHTPATH (MISCELLANEOUS) ×1 IMPLANT
GLOVE SRG 8 PF TXTR STRL LF DI (GLOVE) ×1 IMPLANT
GLOVE SURG ENC TEXT LTX SZ7.5 (GLOVE) ×2 IMPLANT
GLOVE SURG UNDER POLY LF SZ8 (GLOVE) ×2
LENS IOL TECNIS EYHANCE 11.5 (Intraocular Lens) ×1 IMPLANT
NDL FILTER BLUNT 18X1 1/2 (NEEDLE) ×1 IMPLANT
NEEDLE FILTER BLUNT 18X 1/2SAF (NEEDLE) ×1
NEEDLE FILTER BLUNT 18X1 1/2 (NEEDLE) ×1 IMPLANT
SYR 3ML LL SCALE MARK (SYRINGE) ×2 IMPLANT
WATER STERILE IRR 250ML POUR (IV SOLUTION) ×2 IMPLANT

## 2022-05-26 NOTE — Anesthesia Postprocedure Evaluation (Signed)
Anesthesia Post Note  Patient: Vanessa Conley  Procedure(s) Performed: CATARACT EXTRACTION PHACO AND INTRAOCULAR LENS PLACEMENT (IOC) RIGHT 11.31 01:16.6 (Right: Eye)     Patient location during evaluation: PACU Anesthesia Type: MAC Level of consciousness: awake and alert Pain management: pain level controlled Vital Signs Assessment: post-procedure vital signs reviewed and stable Respiratory status: spontaneous breathing, nonlabored ventilation and respiratory function stable Cardiovascular status: blood pressure returned to baseline and stable Postop Assessment: no apparent nausea or vomiting Anesthetic complications: no   No notable events documented.  [place] Ouch

## 2022-05-26 NOTE — H&P (Signed)
[facility]   Primary Care Physician:  Alroy Dust, Carlean Jews.Marlou Sa, MD Ophthalmologist: Dr. Leandrew Koyanagi  Pre-Procedure History & Physical: HPI:  ADELI FROST is a 76 y.o. female here for ophthalmic surgery.   Past Medical History:  Diagnosis Date   Allergy    Arthritis    GERD (gastroesophageal reflux disease)    Hypertension    Wears hearing aid     Past Surgical History:  Procedure Laterality Date   CARPAL TUNNEL RELEASE     CATARACT EXTRACTION  2012    Prior to Admission medications   Medication Sig Start Date End Date Taking? Authorizing Provider  Calcium Carbonate-Vit D-Min (CALCIUM 1200 PO) Take 1 tablet by mouth daily.   Yes [provider]  diclofenac (VOLTAREN) 75 MG EC tablet Take 75 mg by mouth as needed.   Yes [provider]  dorzolamide-timolol (COSOPT) 22.3-6.8 MG/ML ophthalmic solution Place 1 drop into the right eye 2 (two) times daily.   Yes [provider]  esomeprazole (NEXIUM) 20 MG capsule Take 20 mg by mouth daily at 12 noon.   Yes [provider]  latanoprost (XALATAN) 0.005 % ophthalmic solution Place 1 drop into the right eye at bedtime.   Yes [provider]  loratadine (CLARITIN) 10 MG tablet Take 1 tablet (10 mg total) by mouth daily. 02/16/15 05/26/22 Yes Ruffian, III Luanna Cole, PA-C  losartan (COZAAR) 100 MG tablet Take 100 mg by mouth daily.   Yes [provider]  metoprolol succinate (TOPROL-XL) 50 MG 24 hr tablet Take 50 mg by mouth daily. Take with or immediately following a meal.   Yes [provider]  Multiple Vitamins-Minerals (ICAPS AREDS 2 PO) Take by mouth in the morning and at bedtime.   Yes [provider]  ranitidine (ZANTAC) 150 MG tablet Take 1 tablet (150 mg total) by mouth 2 (two) times daily. Patient not taking: Reported on 05/15/2022 02/16/15 02/16/16  Carlena Hurl, PA-C  triamcinolone (KENALOG) 0.025 % ointment Apply 1 application topically 2 (two)  times daily. Patient not taking: Reported on 05/15/2022 02/16/15   Carlena Hurl, PA-C    Allergies as of 04/16/2022 - Review Complete 03/08/2015  Allergen Reaction Noted   Alphagan [brimonidine] Swelling 02/16/2015    History reviewed. No pertinent family history.  Social History   Socioeconomic History   Marital status: Married    Spouse name: Not on file   Number of children: Not on file   Years of education: Not on file   Highest education level: Not on file  Occupational History   Not on file  Tobacco Use   Smoking status: Never   Smokeless tobacco: Never  Substance and Sexual Activity   Alcohol use: No   Drug use: No   Sexual activity: Never  Other Topics Concern   Not on file  Social History Narrative   Not on file   Social Determinants of Health   Financial Resource Strain: Not on file  Food Insecurity: Not on file  Transportation Needs: Not on file  Physical Activity: Not on file  Stress: Not on file  Social Connections: Not on file  Intimate Partner Violence: Not on file    Review of Systems: See HPI, otherwise negative ROS  Physical Exam: Ht '5\' 5"'$  (1.651 m)   Wt 70.8 kg   BMI 25.96 kg/m  General:   Alert,  pleasant and cooperative in NAD Head:  Normocephalic and atraumatic. Lungs:  Clear to auscultation.  Heart:  Regular rate and rhythm.   Impression/Plan: VALENA IVANOV is here for ophthalmic surgery.  Risks, benefits, limitations, and alternatives regarding ophthalmic surgery have been reviewed with the patient.  Questions have been answered.  All parties agreeable.   Leandrew Koyanagi, MD  05/26/2022, 11:51 AM

## 2022-05-26 NOTE — Op Note (Signed)
  LOCATION:  [place]   PREOPERATIVE DIAGNOSIS:    Nuclear sclerotic cataract right eye. H25.11   POSTOPERATIVE DIAGNOSIS:  Nuclear sclerotic cataract right eye.     PROCEDURE:  Phacoemusification with posterior chamber intraocular lens placement of the right eye   ULTRASOUND TIME: Procedure(s): CATARACT EXTRACTION PHACO AND INTRAOCULAR LENS PLACEMENT (IOC) RIGHT (Right)  LENS:   Implant Name Type Inv. Item Serial No. Manufacturer Lot No. LRB No. Used Action  LENS IOL TECNIS EYHANCE 11.5 - [id number] Intraocular Lens LENS IOL TECNIS EYHANCE 11.5 [id number] [employer]  Right 1 Implanted         SURGEON:  Wyonia Hough, MD   ANESTHESIA:  Topical with tetracaine drops and 2% Xylocaine jelly, augmented with 1% preservative-free intracameral lidocaine.    COMPLICATIONS:  None.   DESCRIPTION OF PROCEDURE:  The patient was identified in the holding room and transported to the operating room and placed in the supine position under the operating microscope.  The right eye was identified as the operative eye and it was prepped and draped in the usual sterile ophthalmic fashion.   A 1 millimeter clear-corneal paracentesis was made at the 12:00 position.  0.5 ml of preservative-free 1% lidocaine was injected into the anterior chamber. The anterior chamber was filled with Viscoat viscoelastic.  A 2.4 millimeter keratome was used to make a near-clear corneal incision at the 9:00 position.  A curvilinear capsulorrhexis was made with a cystotome and capsulorrhexis forceps.  Balanced salt solution was used to hydrodissect and hydrodelineate the nucleus.   Phacoemulsification was then used in stop and chop fashion to remove the lens nucleus and epinucleus.  The remaining cortex was then removed using the irrigation and aspiration handpiece. Provisc was then placed into the capsular bag to distend it for lens placement.  A lens was then injected into the capsular bag.  The remaining  viscoelastic was aspirated.   Wounds were hydrated with balanced salt solution.  The anterior chamber was inflated to a physiologic pressure with balanced salt solution.  No wound leaks were noted. Vigamox 0.2 ml of a '1mg'$  per ml solution was injected into the anterior chamber for a dose of 0.2 mg of intracameral antibiotic at the completion of the case.   Timolol drops were applied to the eye.  The patient was taken to the recovery room in stable condition without complications of anesthesia or surgery.   Charle Mclaurin 05/26/2022, 12:45 PM

## 2022-05-26 NOTE — Transfer of Care (Signed)
Immediate Anesthesia Transfer of Care Note  Patient: Vanessa Conley  Procedure(s) Performed: CATARACT EXTRACTION PHACO AND INTRAOCULAR LENS PLACEMENT (IOC) RIGHT 11.31 01:16.6 (Right: Eye)  Patient Location: PACU  Anesthesia Type: MAC  Level of Consciousness: awake, alert  and patient cooperative  Airway and Oxygen Therapy: Patient Spontanous Breathing and Patient connected to supplemental oxygen  Post-op Assessment: Post-op Vital signs reviewed, Patient's Cardiovascular Status Stable, Respiratory Function Stable, Patent Airway and No signs of Nausea or vomiting  Post-op Vital Signs: Reviewed and stable  Complications: No notable events documented.

## 2022-05-27 ENCOUNTER — Encounter: Payer: Self-pay | Admitting: Ophthalmology

## 2022-06-10 DIAGNOSIS — L570 Actinic keratosis: Secondary | ICD-10-CM | POA: Diagnosis not present

## 2022-06-10 DIAGNOSIS — L578 Other skin changes due to chronic exposure to nonionizing radiation: Secondary | ICD-10-CM | POA: Diagnosis not present

## 2022-06-10 DIAGNOSIS — Z86018 Personal history of other benign neoplasm: Secondary | ICD-10-CM | POA: Diagnosis not present

## 2022-07-01 DIAGNOSIS — S299XXA Unspecified injury of thorax, initial encounter: Secondary | ICD-10-CM | POA: Diagnosis not present

## 2022-07-01 DIAGNOSIS — W010XXA Fall on same level from slipping, tripping and stumbling without subsequent striking against object, initial encounter: Secondary | ICD-10-CM | POA: Diagnosis not present

## 2022-07-01 DIAGNOSIS — S20212A Contusion of left front wall of thorax, initial encounter: Secondary | ICD-10-CM | POA: Diagnosis not present

## 2022-07-01 DIAGNOSIS — M8588 Other specified disorders of bone density and structure, other site: Secondary | ICD-10-CM | POA: Diagnosis not present

## 2022-07-01 DIAGNOSIS — S3992XA Unspecified injury of lower back, initial encounter: Secondary | ICD-10-CM | POA: Diagnosis not present

## 2022-07-01 DIAGNOSIS — Z043 Encounter for examination and observation following other accident: Secondary | ICD-10-CM | POA: Diagnosis not present

## 2022-08-21 DIAGNOSIS — M79642 Pain in left hand: Secondary | ICD-10-CM | POA: Diagnosis not present

## 2022-08-21 DIAGNOSIS — M545 Low back pain, unspecified: Secondary | ICD-10-CM | POA: Diagnosis not present

## 2022-08-21 DIAGNOSIS — M85859 Other specified disorders of bone density and structure, unspecified thigh: Secondary | ICD-10-CM | POA: Diagnosis not present

## 2022-08-21 DIAGNOSIS — Z23 Encounter for immunization: Secondary | ICD-10-CM | POA: Diagnosis not present

## 2022-08-21 DIAGNOSIS — E7800 Pure hypercholesterolemia, unspecified: Secondary | ICD-10-CM | POA: Diagnosis not present

## 2022-08-21 DIAGNOSIS — E2839 Other primary ovarian failure: Secondary | ICD-10-CM | POA: Diagnosis not present

## 2022-08-21 DIAGNOSIS — Z0000 Encounter for general adult medical examination without abnormal findings: Secondary | ICD-10-CM | POA: Diagnosis not present

## 2022-08-21 DIAGNOSIS — K219 Gastro-esophageal reflux disease without esophagitis: Secondary | ICD-10-CM | POA: Diagnosis not present

## 2022-08-21 DIAGNOSIS — I10 Essential (primary) hypertension: Secondary | ICD-10-CM | POA: Diagnosis not present

## 2022-08-26 DIAGNOSIS — R21 Rash and other nonspecific skin eruption: Secondary | ICD-10-CM | POA: Diagnosis not present

## 2022-12-18 DIAGNOSIS — H40053 Ocular hypertension, bilateral: Secondary | ICD-10-CM | POA: Diagnosis not present

## 2023-02-19 DIAGNOSIS — M542 Cervicalgia: Secondary | ICD-10-CM | POA: Diagnosis not present

## 2023-02-19 DIAGNOSIS — K219 Gastro-esophageal reflux disease without esophagitis: Secondary | ICD-10-CM | POA: Diagnosis not present

## 2023-02-19 DIAGNOSIS — M79642 Pain in left hand: Secondary | ICD-10-CM | POA: Diagnosis not present

## 2023-02-19 DIAGNOSIS — I10 Essential (primary) hypertension: Secondary | ICD-10-CM | POA: Diagnosis not present

## 2023-02-19 DIAGNOSIS — E7800 Pure hypercholesterolemia, unspecified: Secondary | ICD-10-CM | POA: Diagnosis not present

## 2023-02-19 DIAGNOSIS — M67431 Ganglion, right wrist: Secondary | ICD-10-CM | POA: Diagnosis not present

## 2023-02-26 DIAGNOSIS — R1013 Epigastric pain: Secondary | ICD-10-CM | POA: Diagnosis not present

## 2023-02-26 DIAGNOSIS — R112 Nausea with vomiting, unspecified: Secondary | ICD-10-CM | POA: Diagnosis not present

## 2023-03-13 DIAGNOSIS — Z961 Presence of intraocular lens: Secondary | ICD-10-CM | POA: Diagnosis not present

## 2023-03-13 DIAGNOSIS — H43813 Vitreous degeneration, bilateral: Secondary | ICD-10-CM | POA: Diagnosis not present

## 2023-03-13 DIAGNOSIS — H40053 Ocular hypertension, bilateral: Secondary | ICD-10-CM | POA: Diagnosis not present

## 2023-03-13 DIAGNOSIS — H31322 Choroidal rupture, left eye: Secondary | ICD-10-CM | POA: Diagnosis not present

## 2023-05-13 ENCOUNTER — Other Ambulatory Visit: Payer: Self-pay | Admitting: Family Medicine

## 2023-05-13 DIAGNOSIS — Z1231 Encounter for screening mammogram for malignant neoplasm of breast: Secondary | ICD-10-CM

## 2023-05-29 ENCOUNTER — Ambulatory Visit: Payer: Medicare Other

## 2023-06-11 DIAGNOSIS — L578 Other skin changes due to chronic exposure to nonionizing radiation: Secondary | ICD-10-CM | POA: Diagnosis not present

## 2023-06-11 DIAGNOSIS — Z86018 Personal history of other benign neoplasm: Secondary | ICD-10-CM | POA: Diagnosis not present

## 2023-06-11 DIAGNOSIS — Z872 Personal history of diseases of the skin and subcutaneous tissue: Secondary | ICD-10-CM | POA: Diagnosis not present

## 2023-06-18 ENCOUNTER — Ambulatory Visit: Payer: Medicare Other

## 2023-06-19 ENCOUNTER — Ambulatory Visit
Admission: RE | Admit: 2023-06-19 | Discharge: 2023-06-19 | Disposition: A | Discharge status: Home / Self Care | Payer: Medicare Other | Source: Ambulatory Visit | Attending: Family Medicine | Admitting: Family Medicine

## 2023-06-19 DIAGNOSIS — Z1231 Encounter for screening mammogram for malignant neoplasm of breast: Secondary | ICD-10-CM | POA: Diagnosis not present

## 2023-07-24 DIAGNOSIS — M2042 Other hammer toe(s) (acquired), left foot: Secondary | ICD-10-CM | POA: Diagnosis not present

## 2023-07-24 DIAGNOSIS — B351 Tinea unguium: Secondary | ICD-10-CM | POA: Diagnosis not present

## 2023-07-24 DIAGNOSIS — M79675 Pain in left toe(s): Secondary | ICD-10-CM | POA: Diagnosis not present

## 2023-07-24 DIAGNOSIS — M2041 Other hammer toe(s) (acquired), right foot: Secondary | ICD-10-CM | POA: Diagnosis not present

## 2023-07-24 DIAGNOSIS — M778 Other enthesopathies, not elsewhere classified: Secondary | ICD-10-CM | POA: Diagnosis not present

## 2023-07-24 DIAGNOSIS — M79674 Pain in right toe(s): Secondary | ICD-10-CM | POA: Diagnosis not present

## 2023-08-24 DIAGNOSIS — T50Z95A Adverse effect of other vaccines and biological substances, initial encounter: Secondary | ICD-10-CM | POA: Diagnosis not present

## 2023-08-24 DIAGNOSIS — K219 Gastro-esophageal reflux disease without esophagitis: Secondary | ICD-10-CM | POA: Diagnosis not present

## 2023-08-24 DIAGNOSIS — M8588 Other specified disorders of bone density and structure, other site: Secondary | ICD-10-CM | POA: Diagnosis not present

## 2023-08-24 DIAGNOSIS — Z0000 Encounter for general adult medical examination without abnormal findings: Secondary | ICD-10-CM | POA: Diagnosis not present

## 2023-08-24 DIAGNOSIS — E7800 Pure hypercholesterolemia, unspecified: Secondary | ICD-10-CM | POA: Diagnosis not present

## 2023-08-24 DIAGNOSIS — M85859 Other specified disorders of bone density and structure, unspecified thigh: Secondary | ICD-10-CM | POA: Diagnosis not present

## 2023-08-24 DIAGNOSIS — I10 Essential (primary) hypertension: Secondary | ICD-10-CM | POA: Diagnosis not present

## 2023-09-02 IMAGING — MG MM DIGITAL SCREENING BILAT W/ TOMO AND CAD
6 of 12 series · 6 of 36 positions shown · non-contrast
Comparison: Previous exam(s).

CLINICAL DATA: Screening.

EXAM:
DIGITAL SCREENING BILATERAL MAMMOGRAM WITH TOMOSYNTHESIS AND CAD
TECHNIQUE: Bilateral screening digital craniocaudal and mediolateral oblique
mammograms were obtained. Bilateral screening digital breast
tomosynthesis was performed. The images were evaluated with
computer-aided detection.

[R MLO synth-2D (1 of 2)]
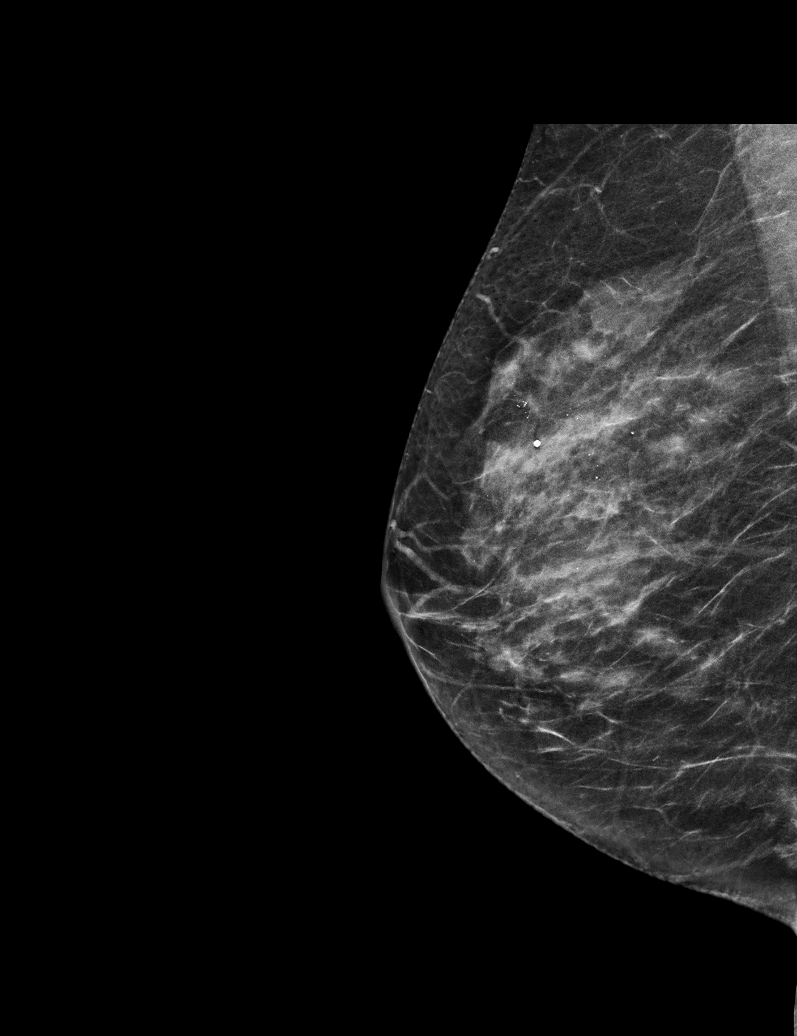

[L CC synth-2D]
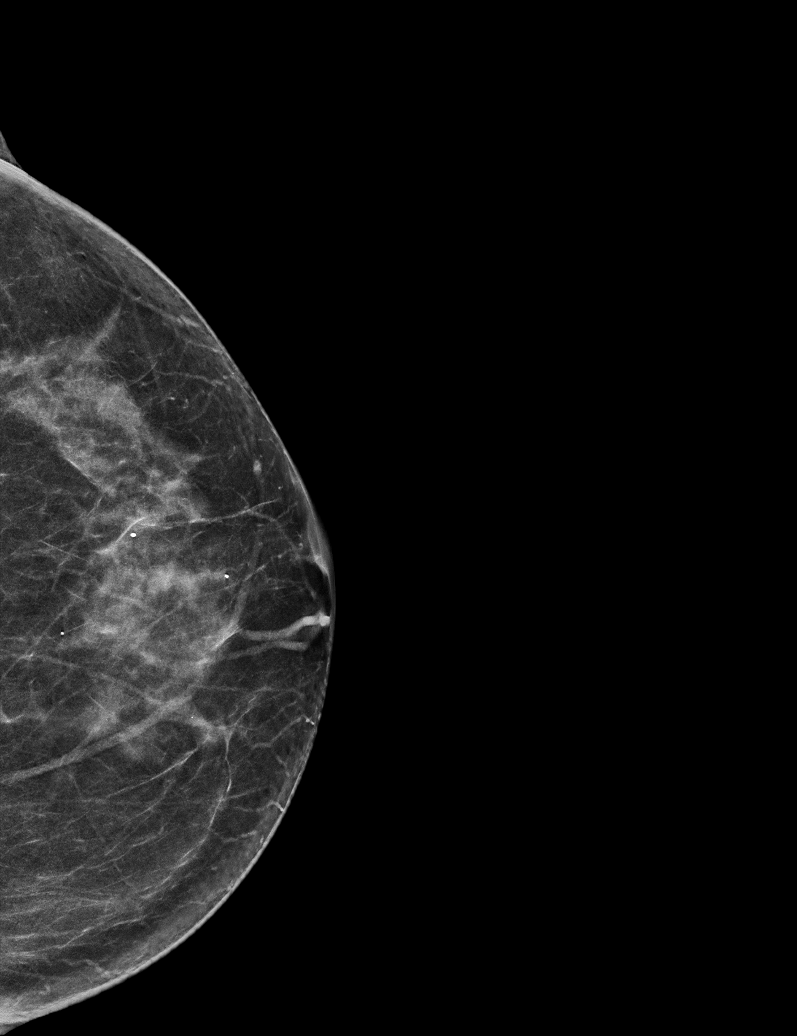

[L MLO synth-2D (1 of 2)]
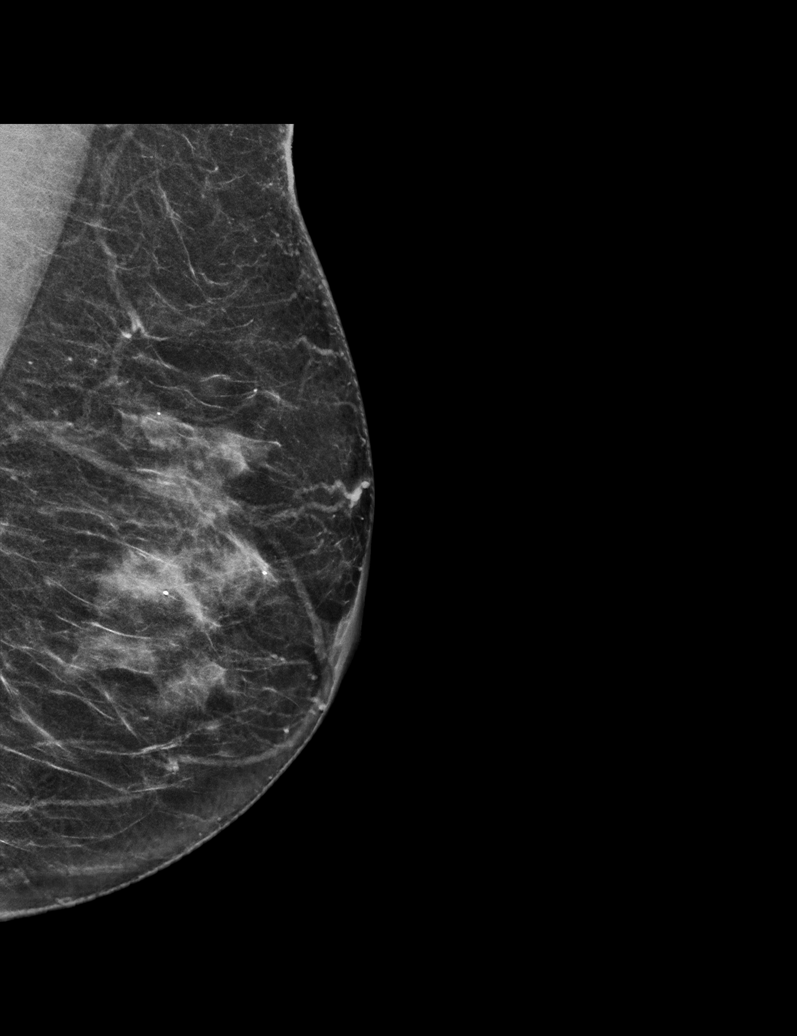

[R CC synth-2D]
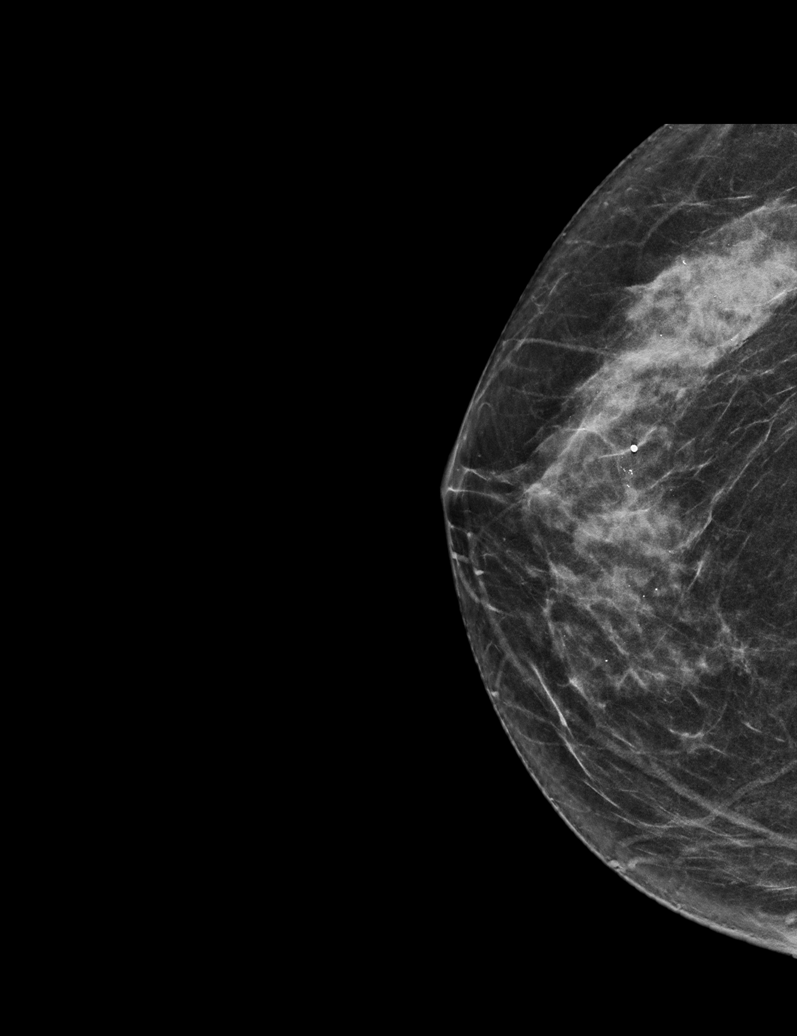

[R MLO synth-2D (2 of 2)]
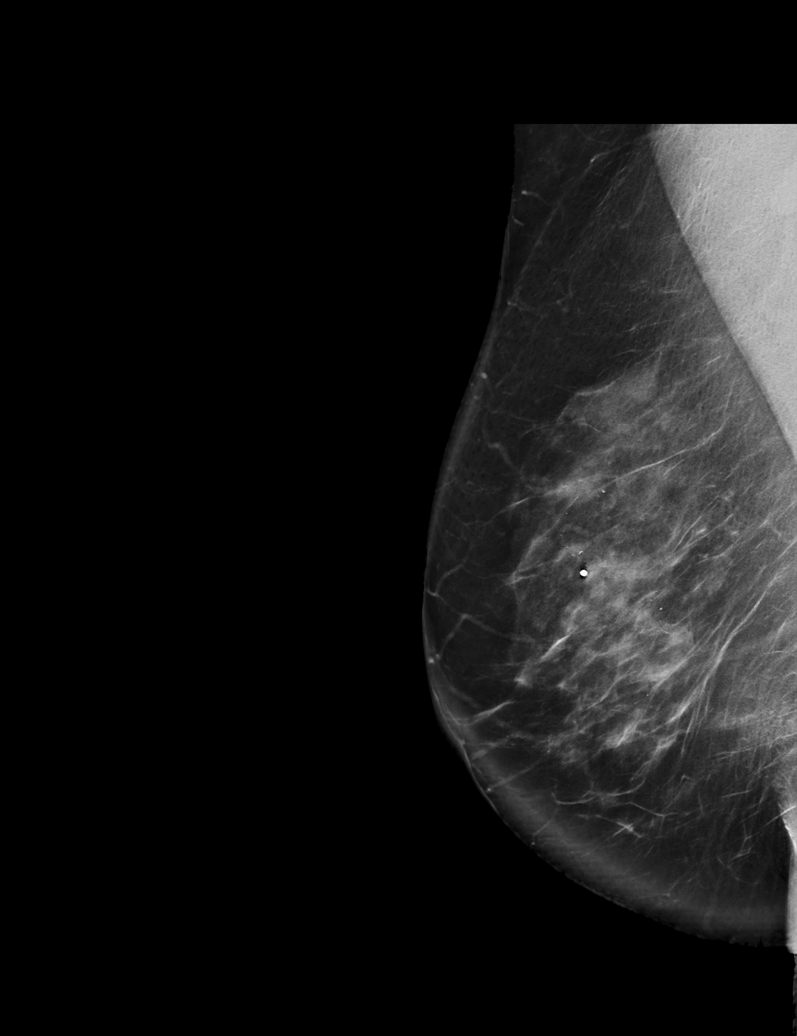

[L MLO synth-2D (2 of 2)]
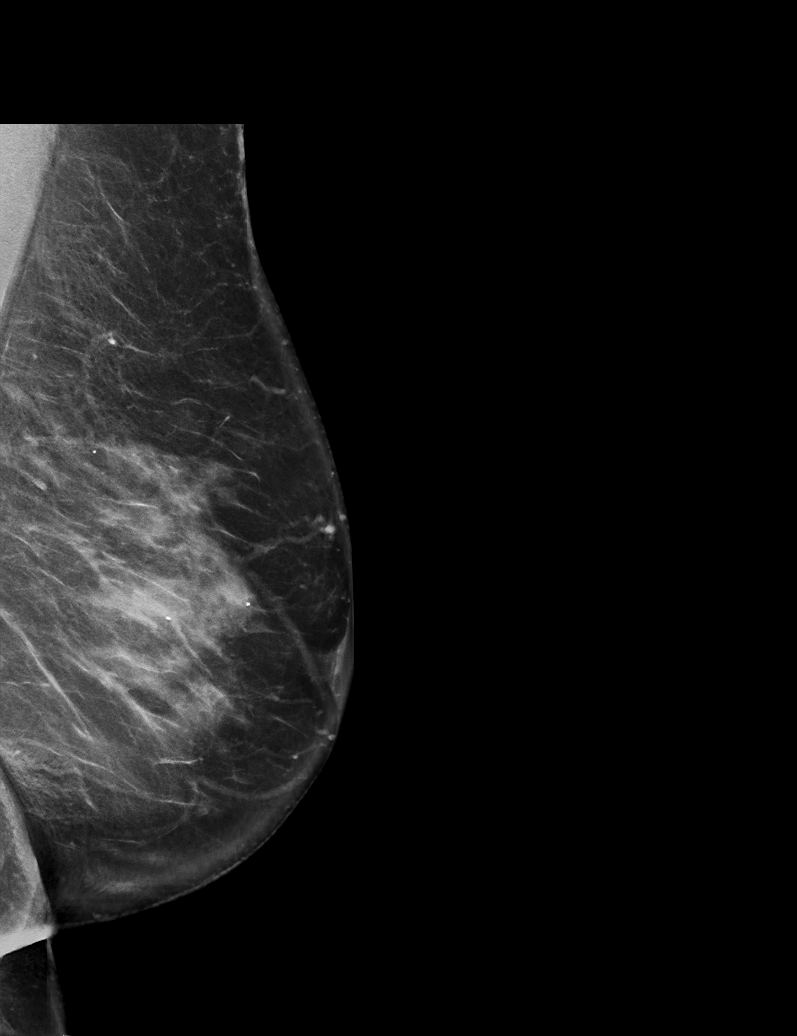

[6 of 36 positions shown; findings below may reference images not displayed]

ACR Breast Density Category c: The breast tissue is heterogeneously
dense, which may obscure small masses.
FINDINGS: There are no findings suspicious for malignancy.
IMPRESSION: No mammographic evidence of malignancy. A result letter of this
screening mammogram will be mailed directly to the patient.

RECOMMENDATION:
Screening mammogram in one year. (Code:[id number])

BI-RADS CATEGORY  1: Negative.

## 2023-09-21 DIAGNOSIS — Z961 Presence of intraocular lens: Secondary | ICD-10-CM | POA: Diagnosis not present

## 2023-09-21 DIAGNOSIS — H40053 Ocular hypertension, bilateral: Secondary | ICD-10-CM | POA: Diagnosis not present

## 2023-09-21 DIAGNOSIS — H31322 Choroidal rupture, left eye: Secondary | ICD-10-CM | POA: Diagnosis not present

## 2023-10-01 ENCOUNTER — Ambulatory Visit: Payer: Medicare Other | Admitting: Allergy & Immunology

## 2023-10-01 ENCOUNTER — Encounter: Payer: Self-pay | Admitting: Allergy & Immunology

## 2023-10-01 ENCOUNTER — Other Ambulatory Visit: Payer: Self-pay

## 2023-10-01 VITALS — BP 122/78 | HR 60 | Temp 98.2°F | Resp 17 | Ht 66.0 in | Wt 164.4 lb

## 2023-10-01 DIAGNOSIS — Z887 Allergy status to serum and vaccine status: Secondary | ICD-10-CM | POA: Diagnosis not present

## 2023-10-01 DIAGNOSIS — L500 Allergic urticaria: Secondary | ICD-10-CM | POA: Diagnosis not present

## 2023-10-01 DIAGNOSIS — Z23 Encounter for immunization: Secondary | ICD-10-CM

## 2023-10-01 DIAGNOSIS — T50Z95D Adverse effect of other vaccines and biological substances, subsequent encounter: Secondary | ICD-10-CM

## 2023-10-01 MED ORDER — PREDNISONE 10 MG PO TABS: ORAL_TABLET | ORAL | 0 refills | Status: AC

## 2023-10-01 NOTE — Progress Notes (Unsigned)
NEW PATIENT  Date of Service/Encounter:  10/01/23  Consult requested by: Vanessa Coe, MD   Assessment:   Encounter for immunization  Allergic urticaria to flu shots in the past - tolerated skin testing and challenge today  Plan/Recommendations:   1. Adverse reaction to the flu vaccine - Vanessa Conley tolerated the flu vaccine testing and administration without a problem. - Call us with any problems. - I did send in prednisone just in case things happen tonight. - We have physicians on call 24/7!   2. Return if symptoms worsen or fail to improve.   This note in its entirety was forwarded to the Provider who requested this consultation.  Subjective:   Vanessa Conley is a 77 y.o. female presenting today for evaluation of  Chief Complaint  Patient presents with   Allergic Reaction    Flu shot   Rash    Vanessa Conley has a history of the following: There are no active problems to display for this patient.   History obtained from: chart review and patient and her husband .  Vanessa Conley was referred by Vanessa Coe, MD.     Vanessa Conley is a 77 y.o. female presenting for an evaluation of a possible flu vaccine reaction .  Vanessa Conley presents with a concern regarding her reaction to the flu shot. Approximately four years ago, she contracted COVID-19, with her only symptom being an outbreak of hives. She did not experience any other typical COVID-19 symptoms such as fever or respiratory distress.  The following year, she received her annual flu shot without any adverse reactions. However, in 2020, she experienced a significant reaction to the flu shot. A few hours post-vaccination, she developed severe itching, which progressed to a full-blown rash by the next morning. The rash was flat and not bumpy, differentiating it from her previous hives. She sought medical attention at a walk-in clinic where she was prescribed prednisone, which helped resolve the rash. She did not have breathing  problems or other systemic symptoms at all.   Since this incident, she has not received her flu shot due to concerns about a potential severe reaction. She has not had any reactions to other vaccines, including the COVID-19 vaccine, which she received without any adverse effects. She does not have any known environmental or food allergies, asthma, or other allergic conditions. She occasionally takes antihistamines for hay fever symptoms.  She has not had any recent rashes or other allergic reactions. She has not received any other vaccines since her adverse reaction to the flu shot. She does not know whether or not she has reacted to gelatin or other vaccine additives in the past.     Otherwise, there is no history of other atopic diseases, including asthma, food allergies, drug allergies, stinging insect allergies, or contact dermatitis. There is no significant infectious history. Vaccinations are up to date.    Past Medical History: There are no active problems to display for this patient.   Medication List:  Allergies as of 10/01/2023       Reactions   Alphagan [brimonidine] Swelling   Augmentin [amoxicillin-pot Clavulanate] Rash        Medication List        Accurate as of October 01, 2023 11:59 PM. If you have any questions, ask your nurse or doctor.          amLODipine 5 MG tablet Commonly known as: NORVASC Take 5 mg by mouth daily.   CALCIUM 1200 PO Take  1 tablet by mouth daily.   diclofenac 75 MG EC tablet Commonly known as: VOLTAREN Take 75 mg by mouth as needed.   dorzolamide 2 % ophthalmic solution Commonly known as: TRUSOPT 1 drop 2 (two) times daily.   dorzolamide-timolol 2-0.5 % ophthalmic solution Commonly known as: COSOPT Place 1 drop into the right eye 2 (two) times daily.   esomeprazole 20 MG capsule Commonly known as: NEXIUM Take 20 mg by mouth daily at 12 noon.   ICAPS AREDS 2 PO Take by mouth in the morning and at bedtime.   latanoprost  0.005 % ophthalmic solution Commonly known as: XALATAN Place 1 drop into the right eye at bedtime.   loratadine 10 MG tablet Commonly known as: Claritin Take 1 tablet (10 mg total) by mouth daily.   losartan 100 MG tablet Commonly known as: COZAAR Take 100 mg by mouth daily.   metoprolol succinate 50 MG 24 hr tablet Commonly known as: TOPROL-XL Take 50 mg by mouth daily. Take with or immediately following a meal.   Omega 3 1000 MG Caps 1 capsule Orally Once a day   predniSONE 10 MG tablet Commonly known as: DELTASONE Take two tablets (20mg ) twice daily for three days, then one tablet (10mg ) twice daily for three days, then STOP. Started by: Alfonse Spruce   ranitidine 150 MG tablet Commonly known as: Zantac Take 1 tablet (150 mg total) by mouth 2 (two) times daily.   timolol 0.5 % ophthalmic solution Commonly known as: TIMOPTIC 1 drop 2 (two) times daily.   triamcinolone 0.025 % ointment Commonly known as: KENALOG Apply 1 application topically 2 (two) times daily.        Birth History: born at term without complications  Developmental History: non-contributory  Past Surgical History: Past Surgical History:  Procedure Laterality Date   CARPAL TUNNEL RELEASE     CATARACT EXTRACTION  2012   CATARACT EXTRACTION W/PHACO Right 05/26/2022   Procedure: CATARACT EXTRACTION PHACO AND INTRAOCULAR LENS PLACEMENT (IOC) RIGHT 11.31 01:16.6;  Surgeon: Lockie Mola, MD;  Location: [facility];  Service: Ophthalmology;  Laterality: Right;     Family History: History reviewed. No pertinent family history.   Social History: Vanessa Conley lives at home with her husband.  She is in a house that is 106 years old.  There is hardwood in the main living areas and carpeting in the bedroom.  They have electric heating and central cooling.  There is a cat inside of the home.  There are dust mite covers on the bed and the pillows.  There is no tobacco exposure.  She currently  is retired, but previously worked as an Charity fundraiser at [employer].  There is no current fume, chemical, or dust exposure.  They do have a HEPA filter in the home.  They do not live near an interstate or industrial area.  There is no tobacco exposure.   Review of systems otherwise negative other than that mentioned in the HPI.    Objective:   Blood pressure 122/78, pulse 60, temperature 98.2 F (36.8 C), temperature source Temporal, resp. rate 17, height 5\' 6"  (1.676 m), weight 164 lb 6.4 oz (74.6 kg), SpO2 98%. Body mass index is 26.53 kg/m.     Physical Exam Vitals reviewed.  Constitutional:      Appearance: She is well-developed.     Comments: Pleasant. Talkative.   HENT:     Head: Normocephalic and atraumatic.     Right Ear: Tympanic membrane, ear  canal and external ear normal. No drainage, swelling or tenderness. Tympanic membrane is not injected, scarred, erythematous, retracted or bulging.     Left Ear: Tympanic membrane, ear canal and external ear normal. No drainage, swelling or tenderness. Tympanic membrane is not injected, scarred, erythematous, retracted or bulging.     Nose: No nasal deformity, septal deviation, mucosal edema or rhinorrhea.     Right Sinus: No maxillary sinus tenderness or frontal sinus tenderness.     Left Sinus: No maxillary sinus tenderness or frontal sinus tenderness.     Mouth/Throat:     Mouth: Mucous membranes are not pale and not dry.     Pharynx: Uvula midline.  Eyes:     General:        Right eye: No discharge.        Left eye: No discharge.     Conjunctiva/sclera: Conjunctivae normal.     Right eye: Right conjunctiva is not injected. No chemosis.    Left eye: Left conjunctiva is not injected. No chemosis.    Pupils: Pupils are equal, round, and reactive to light.  Cardiovascular:     Rate and Rhythm: Normal rate and regular rhythm.     Heart sounds: Normal heart sounds.  Pulmonary:     Effort: Pulmonary effort is normal. No  tachypnea, accessory muscle usage or respiratory distress.     Breath sounds: Normal breath sounds. No wheezing, rhonchi or rales.  Chest:     Chest wall: No tenderness.  Abdominal:     Tenderness: There is no abdominal tenderness. There is no guarding or rebound.  Lymphadenopathy:     Head:     Right side of head: No submandibular, tonsillar or occipital adenopathy.     Left side of head: No submandibular, tonsillar or occipital adenopathy.     Cervical: No cervical adenopathy.  Skin:    Coloration: Skin is not pale.     Findings: No abrasion, erythema, petechiae or rash. Rash is not papular, urticarial or vesicular.  Neurological:     Mental Status: She is alert.  Psychiatric:        Behavior: Behavior is cooperative.      Diagnostic studies:    Allergy Studies:     Airborne Adult Perc - 10/01/23 1500     Time Antigen Placed 1524    Location Arm    Number of Test 3    1. Control-Buffer 50% Glycerol Negative    2. Control-Histamine 3+    2. Other Negative   Flu Vaccine           Patient then received 25% of the dose followed by 30-minute wait time.  She did have some itching, but she thinks it might have been more related to nerves.  Her husband thought that he noticed a slight red rash on the face, but we decided to continue with this anyway. Vitals were normal.  She received 75% of the dose and tolerated this well.  She was monitored for 1 hour following the last injection.   Allergy testing results were read and interpreted by myself, documented by clinical staff.         Malachi Bonds, MD [facility]

## 2023-10-01 NOTE — Patient Instructions (Signed)
1. Adverse reaction to the flu vaccine - Zarya tolerated the flu vaccine testing and administration without a problem. - Call us with any problems. - I did send in prednisone just in case things happen tonight. - We have physicians on call 24/7!   2. Return if symptoms worsen or fail to improve.    Please inform us of any Emergency Department visits, hospitalizations, or changes in symptoms. Call us before going to the ED for breathing or allergy symptoms since we might be able to fit you in for a sick visit. Feel free to contact us anytime with any questions, problems, or concerns.  It was a pleasure to meet you and your family today!  Websites that have reliable patient information: 1. American Academy of Asthma, Allergy, and Immunology: www.aaaai.org 2. Food Allergy Research and Education (FARE): foodallergy.org 3. Mothers of Asthmatics: http://www.asthmacommunitynetwork.org 4. American College of Allergy, Asthma, and Immunology: www.acaai.org      "Like" [place] on Facebook and Instagram for our latest updates!      A healthy democracy works best when [employer] participate! Make sure you are registered to vote! If you have moved or changed any of your contact information, you will need to get this updated before voting! Scan the QR codes below to learn more!

## 2023-11-16 DIAGNOSIS — I10 Essential (primary) hypertension: Secondary | ICD-10-CM | POA: Diagnosis not present

## 2023-11-16 DIAGNOSIS — R600 Localized edema: Secondary | ICD-10-CM | POA: Diagnosis not present

## 2024-01-06 DIAGNOSIS — I872 Venous insufficiency (chronic) (peripheral): Secondary | ICD-10-CM | POA: Diagnosis not present

## 2024-02-04 DIAGNOSIS — I872 Venous insufficiency (chronic) (peripheral): Secondary | ICD-10-CM | POA: Diagnosis not present

## 2024-02-08 DIAGNOSIS — Z01818 Encounter for other preprocedural examination: Secondary | ICD-10-CM | POA: Diagnosis not present

## 2024-02-08 DIAGNOSIS — I872 Venous insufficiency (chronic) (peripheral): Secondary | ICD-10-CM | POA: Diagnosis not present

## 2024-02-11 DIAGNOSIS — Z01818 Encounter for other preprocedural examination: Secondary | ICD-10-CM | POA: Diagnosis not present

## 2024-02-11 DIAGNOSIS — I872 Venous insufficiency (chronic) (peripheral): Secondary | ICD-10-CM | POA: Diagnosis not present

## 2024-02-15 DIAGNOSIS — I872 Venous insufficiency (chronic) (peripheral): Secondary | ICD-10-CM | POA: Diagnosis not present

## 2024-02-15 DIAGNOSIS — Z01818 Encounter for other preprocedural examination: Secondary | ICD-10-CM | POA: Diagnosis not present

## 2024-02-23 DIAGNOSIS — M85859 Other specified disorders of bone density and structure, unspecified thigh: Secondary | ICD-10-CM | POA: Diagnosis not present

## 2024-02-23 DIAGNOSIS — M8588 Other specified disorders of bone density and structure, other site: Secondary | ICD-10-CM | POA: Diagnosis not present

## 2024-02-23 DIAGNOSIS — E7800 Pure hypercholesterolemia, unspecified: Secondary | ICD-10-CM | POA: Diagnosis not present

## 2024-02-23 DIAGNOSIS — R7309 Other abnormal glucose: Secondary | ICD-10-CM | POA: Diagnosis not present

## 2024-02-23 DIAGNOSIS — I10 Essential (primary) hypertension: Secondary | ICD-10-CM | POA: Diagnosis not present

## 2024-02-25 DIAGNOSIS — R7303 Prediabetes: Secondary | ICD-10-CM | POA: Diagnosis not present

## 2024-02-25 DIAGNOSIS — M85859 Other specified disorders of bone density and structure, unspecified thigh: Secondary | ICD-10-CM | POA: Diagnosis not present

## 2024-02-25 DIAGNOSIS — E7800 Pure hypercholesterolemia, unspecified: Secondary | ICD-10-CM | POA: Diagnosis not present

## 2024-02-25 DIAGNOSIS — R7309 Other abnormal glucose: Secondary | ICD-10-CM | POA: Diagnosis not present

## 2024-02-25 DIAGNOSIS — I10 Essential (primary) hypertension: Secondary | ICD-10-CM | POA: Diagnosis not present

## 2024-03-14 DIAGNOSIS — H40053 Ocular hypertension, bilateral: Secondary | ICD-10-CM | POA: Diagnosis not present

## 2024-03-21 DIAGNOSIS — H40053 Ocular hypertension, bilateral: Secondary | ICD-10-CM | POA: Diagnosis not present

## 2024-03-21 DIAGNOSIS — Z961 Presence of intraocular lens: Secondary | ICD-10-CM | POA: Diagnosis not present

## 2024-03-21 DIAGNOSIS — H353131 Nonexudative age-related macular degeneration, bilateral, early dry stage: Secondary | ICD-10-CM | POA: Diagnosis not present

## 2024-03-21 DIAGNOSIS — H43813 Vitreous degeneration, bilateral: Secondary | ICD-10-CM | POA: Diagnosis not present

## 2024-04-11 DIAGNOSIS — E7800 Pure hypercholesterolemia, unspecified: Secondary | ICD-10-CM | POA: Diagnosis not present

## 2024-04-11 DIAGNOSIS — I10 Essential (primary) hypertension: Secondary | ICD-10-CM | POA: Diagnosis not present

## 2024-05-12 DIAGNOSIS — E7800 Pure hypercholesterolemia, unspecified: Secondary | ICD-10-CM | POA: Diagnosis not present

## 2024-05-12 DIAGNOSIS — I10 Essential (primary) hypertension: Secondary | ICD-10-CM | POA: Diagnosis not present

## 2024-06-02 DIAGNOSIS — B351 Tinea unguium: Secondary | ICD-10-CM | POA: Diagnosis not present

## 2024-06-02 DIAGNOSIS — M2041 Other hammer toe(s) (acquired), right foot: Secondary | ICD-10-CM | POA: Diagnosis not present

## 2024-06-02 DIAGNOSIS — M778 Other enthesopathies, not elsewhere classified: Secondary | ICD-10-CM | POA: Diagnosis not present

## 2024-06-02 DIAGNOSIS — M2042 Other hammer toe(s) (acquired), left foot: Secondary | ICD-10-CM | POA: Diagnosis not present

## 2024-06-02 DIAGNOSIS — M79674 Pain in right toe(s): Secondary | ICD-10-CM | POA: Diagnosis not present

## 2024-06-02 DIAGNOSIS — M79675 Pain in left toe(s): Secondary | ICD-10-CM | POA: Diagnosis not present

## 2024-06-12 DIAGNOSIS — E7800 Pure hypercholesterolemia, unspecified: Secondary | ICD-10-CM | POA: Diagnosis not present

## 2024-06-12 DIAGNOSIS — I10 Essential (primary) hypertension: Secondary | ICD-10-CM | POA: Diagnosis not present

## 2024-06-14 DIAGNOSIS — Z86018 Personal history of other benign neoplasm: Secondary | ICD-10-CM | POA: Diagnosis not present

## 2024-06-14 DIAGNOSIS — Z872 Personal history of diseases of the skin and subcutaneous tissue: Secondary | ICD-10-CM | POA: Diagnosis not present

## 2024-06-14 DIAGNOSIS — L578 Other skin changes due to chronic exposure to nonionizing radiation: Secondary | ICD-10-CM | POA: Diagnosis not present

## 2024-06-14 DIAGNOSIS — L814 Other melanin hyperpigmentation: Secondary | ICD-10-CM | POA: Diagnosis not present

## 2024-07-06 DIAGNOSIS — K219 Gastro-esophageal reflux disease without esophagitis: Secondary | ICD-10-CM | POA: Diagnosis not present

## 2024-07-06 DIAGNOSIS — R7303 Prediabetes: Secondary | ICD-10-CM | POA: Diagnosis not present

## 2024-07-06 DIAGNOSIS — K121 Other forms of stomatitis: Secondary | ICD-10-CM | POA: Diagnosis not present

## 2024-07-06 DIAGNOSIS — I10 Essential (primary) hypertension: Secondary | ICD-10-CM | POA: Diagnosis not present

## 2024-07-12 DIAGNOSIS — I10 Essential (primary) hypertension: Secondary | ICD-10-CM | POA: Diagnosis not present

## 2024-07-12 DIAGNOSIS — E7800 Pure hypercholesterolemia, unspecified: Secondary | ICD-10-CM | POA: Diagnosis not present

## 2024-08-16 ENCOUNTER — Ambulatory Visit
Admission: RE | Admit: 2024-08-16 | Discharge: 2024-08-16 | Disposition: A | Source: Ambulatory Visit | Attending: Internal Medicine

## 2024-08-16 ENCOUNTER — Other Ambulatory Visit: Payer: Self-pay | Admitting: Internal Medicine

## 2024-08-16 DIAGNOSIS — M25552 Pain in left hip: Secondary | ICD-10-CM

## 2024-08-18 ENCOUNTER — Other Ambulatory Visit: Payer: Self-pay | Admitting: Physician Assistant

## 2024-08-18 DIAGNOSIS — H903 Sensorineural hearing loss, bilateral: Secondary | ICD-10-CM

## 2024-09-01 ENCOUNTER — Ambulatory Visit
Admission: RE | Admit: 2024-09-01 | Discharge: 2024-09-01 | Disposition: A | Source: Ambulatory Visit | Attending: Physician Assistant | Admitting: Physician Assistant

## 2024-09-01 DIAGNOSIS — H903 Sensorineural hearing loss, bilateral: Secondary | ICD-10-CM

## 2024-09-01 MED ORDER — GADOPICLENOL 0.5 MMOL/ML IV SOLN
7.5000 mL | Freq: Once | INTRAVENOUS | Status: AC | PRN
  Administered 2024-09-01: 6 mL via INTRAVENOUS

## 2024-09-15 ENCOUNTER — Other Ambulatory Visit (HOSPITAL_COMMUNITY): Payer: Self-pay

## 2024-09-15 DIAGNOSIS — M461 Sacroiliitis, not elsewhere classified: Secondary | ICD-10-CM

## 2024-09-15 DIAGNOSIS — M25552 Pain in left hip: Secondary | ICD-10-CM

## 2024-09-20 NOTE — Progress Notes (Signed)
 Vanessa Conley wears two Glade Erp AI 20 hearing aids.  Her trial period ends December 23rd.  She is having difficulty hearing speech with these aids.  I will switch them out for [facility] Sphere I 70 hearing aids.  She will return in three weeks to pick them up.  She will return Thursday to drop her Glade aids off with production assistant, radio for credit.

## 2024-09-22 NOTE — Progress Notes (Signed)
 Vanessa Conley returned her Glade aids.  I have ordered a set of Phonak aids for her to try.

## 2024-09-28 NOTE — Progress Notes (Signed)
 HA INVOICE

## 2024-09-29 ENCOUNTER — Ambulatory Visit (HOSPITAL_COMMUNITY): Admission: RE | Admit: 2024-09-29 | Discharge: 2024-09-29

## 2024-09-29 DIAGNOSIS — M25552 Pain in left hip: Secondary | ICD-10-CM | POA: Insufficient documentation

## 2024-09-29 DIAGNOSIS — M461 Sacroiliitis, not elsewhere classified: Secondary | ICD-10-CM | POA: Diagnosis present

## 2024-10-12 ENCOUNTER — Other Ambulatory Visit: Payer: Self-pay | Admitting: Family Medicine

## 2024-10-12 DIAGNOSIS — Z1231 Encounter for screening mammogram for malignant neoplasm of breast: Secondary | ICD-10-CM

## 2024-11-03 ENCOUNTER — Ambulatory Visit
Admission: RE | Admit: 2024-11-03 | Discharge: 2024-11-03 | Disposition: A | Discharge status: Home / Self Care | Source: Ambulatory Visit | Attending: Family Medicine | Admitting: Family Medicine

## 2024-11-03 DIAGNOSIS — Z1231 Encounter for screening mammogram for malignant neoplasm of breast: Secondary | ICD-10-CM
